# Patient Record
Sex: Male | Born: 1975
Health system: Southern US, Community
[De-identification: ages and names within clinical notes are randomized; demographics above are authoritative.]

## PROBLEM LIST (undated history)

## (undated) DIAGNOSIS — E785 Hyperlipidemia, unspecified: Secondary | ICD-10-CM

## (undated) DIAGNOSIS — N189 Chronic kidney disease, unspecified: Secondary | ICD-10-CM

## (undated) HISTORY — DX: Chronic kidney disease, unspecified: N18.9

## (undated) HISTORY — DX: Hyperlipidemia, unspecified: E78.5

---

## 1999-12-03 HISTORY — PX: APPENDECTOMY: SHX54

## 2012-07-17 ENCOUNTER — Encounter: Payer: Self-pay | Admitting: Internal Medicine

## 2012-07-17 ENCOUNTER — Ambulatory Visit (INDEPENDENT_AMBULATORY_CARE_PROVIDER_SITE_OTHER): Payer: BC Managed Care – PPO | Admitting: Internal Medicine

## 2012-07-17 VITALS — BP 102/70 | HR 67 | Temp 97.0°F | Ht 71.5 in | Wt 141.1 lb

## 2012-07-17 DIAGNOSIS — Z0001 Encounter for general adult medical examination with abnormal findings: Secondary | ICD-10-CM | POA: Insufficient documentation

## 2012-07-17 DIAGNOSIS — L259 Unspecified contact dermatitis, unspecified cause: Secondary | ICD-10-CM

## 2012-07-17 DIAGNOSIS — M25561 Pain in right knee: Secondary | ICD-10-CM | POA: Insufficient documentation

## 2012-07-17 DIAGNOSIS — Z0000 Encounter for general adult medical examination without abnormal findings: Secondary | ICD-10-CM

## 2012-07-17 DIAGNOSIS — M25569 Pain in unspecified knee: Secondary | ICD-10-CM

## 2012-07-17 MED ORDER — METHYLPREDNISOLONE ACETATE 80 MG/ML IJ SUSP
120.0000 mg | Freq: Once | INTRAMUSCULAR | Status: AC
  Administered 2012-07-17: 120 mg via INTRAMUSCULAR

## 2012-07-17 MED ORDER — MELOXICAM 15 MG PO TABS: 15.0000 mg | ORAL_TABLET | Freq: Every day | ORAL | Status: DC

## 2012-07-17 MED ORDER — TRIAMCINOLONE ACETONIDE 0.1 % EX CREA: TOPICAL_CREAM | Freq: Two times a day (BID) | CUTANEOUS | Status: DC

## 2012-07-17 MED ORDER — PREDNISONE 10 MG PO TABS: ORAL_TABLET | ORAL | Status: DC

## 2012-07-17 NOTE — Patient Instructions (Addendum)
You had the steroid shot today Take all new medications as prescribed Continue all other medications as before Please call if not improved, for referral to orthopedics such as Dr Darrick Penna Please return in 6 mo with Lab testing done 3-5 days before

## 2012-07-18 ENCOUNTER — Encounter: Payer: Self-pay | Admitting: Internal Medicine

## 2012-07-18 NOTE — Assessment & Plan Note (Addendum)
PE benign, hx c/w iliotibial band disorder, for nsaid prn, for slower start to running distances to begin, consider ortho eval and/or shoe evaluation if he pronates, as well as consider run on more level gound, declines film today

## 2012-07-18 NOTE — Progress Notes (Signed)
  Subjective:    Patient ID: Marvin [name], male    DOB: [birth year], 36 y.o.   MRN: [id number]  HPI  Here as new pt to establish, c/o rash after an episode of walking in the woods that seems to be spreading below the knees, and some to the arms as well, 3 weeks, not better or worse with anything, no fever or trauma.  Also with c/o sharp  lateral right knee pain after starting up a new running for exercise a few wks ago, no pain with walking , but only with starting to run.  No swelling, giveaways, or falls.  No prior hx.  Overall mild but recurs every time.  Pt denies chest pain, increased sob or doe, wheezing, orthopnea, PND, increased LE swelling, palpitations, dizziness or syncope.   Pt denies polydipsia, polyuria.  Pt denies new neurological symptoms such as new headache, or facial or extremity weakness or numbness   Pt denies fever, wt loss, night sweats, loss of appetite, or other constitutional symptoms History reviewed. No pertinent past medical history. Past Surgical History  Procedure Date  . Appendectomy 2001    reports that he has never smoked. He has never used smokeless tobacco. He reports that he drinks alcohol. He reports that he does not use illicit drugs. family history includes Diabetes in his other; Heart disease in his other; Hyperlipidemia in his other; Hypertension in his other; and Sudden death in his other. No Known Allergies No current outpatient prescriptions on file prior to visit.   Review of Systems All otherwise neg per pt   Objective:   Physical Exam BP 102/70  Pulse 67  Temp 97 F (36.1 C) (Oral)  Ht 5' 11.5" (1.816 m)  Wt 141 lb 2 oz (64.014 kg)  BMI 19.41 kg/m2  SpO2 98% Physical Exam  VS noted Constitutional: Pt appears well-developed and well-nourished.  HENT: Head: Normocephalic.  Right Ear: External ear normal.  Left Ear: External ear normal.  Eyes: Conjunctivae and EOM are normal. Pupils are equal, round, and reactive to light.  Neck: Normal  range of motion. Neck supple.  Cardiovascular: Normal rate and regular rhythm.   Pulmonary/Chest: Effort normal and breath sounds normal.  Abd:  Soft, NT, non-distended, + BS Right knee with FROM, NT, no swelling, lockman's neg Neurological: Pt is alert. Not confused  Motor/dtr/gait intact Skin: Skin is warm. Quite dramatic weepy extensive but typical contact dermatitis like rash below the knees pretibial bilat, several lesion to arms as well Psychiatric: Pt behavior is normal. Thought content normal.     Assessment & Plan:

## 2012-07-18 NOTE — Assessment & Plan Note (Signed)
Mild to mod, for depomedrol IM, predpack asd,  to f/u any worsening symptoms or concerns 

## 2013-01-13 ENCOUNTER — Other Ambulatory Visit (INDEPENDENT_AMBULATORY_CARE_PROVIDER_SITE_OTHER): Payer: BC Managed Care – PPO

## 2013-01-13 DIAGNOSIS — Z0000 Encounter for general adult medical examination without abnormal findings: Secondary | ICD-10-CM

## 2013-01-13 LAB — CBC WITH DIFFERENTIAL/PLATELET
Basophils Absolute: 0 10*3/uL (ref 0.0–0.1)
Basophils Relative: 1 % (ref 0.0–3.0)
Eosinophils Absolute: 0.1 10*3/uL (ref 0.0–0.7)
Eosinophils Relative: 2 % (ref 0.0–5.0)
HCT: 48.3 % (ref 39.0–52.0)
Hemoglobin: 16.9 g/dL (ref 13.0–17.0)
Lymphocytes Relative: 40.2 % (ref 12.0–46.0)
Lymphs Abs: 2 10*3/uL (ref 0.7–4.0)
MCHC: 34.9 g/dL (ref 30.0–36.0)
MCV: 90.8 fl (ref 78.0–100.0)
Monocytes Absolute: 0.4 10*3/uL (ref 0.1–1.0)
Monocytes Relative: 8.8 % (ref 3.0–12.0)
Neutro Abs: 2.3 10*3/uL (ref 1.4–7.7)
Neutrophils Relative %: 48 % (ref 43.0–77.0)
Platelets: 184 10*3/uL (ref 150.0–400.0)
RBC: 5.32 Mil/uL (ref 4.22–5.81)
RDW: 12.4 % (ref 11.5–14.6)
WBC: 4.9 10*3/uL (ref 4.5–10.5)

## 2013-01-13 LAB — HEPATIC FUNCTION PANEL
ALT: 22 U/L (ref 0–53)
AST: 22 U/L (ref 0–37)
Albumin: 4.4 g/dL (ref 3.5–5.2)
Alkaline Phosphatase: 48 U/L (ref 39–117)
Bilirubin, Direct: 0.2 mg/dL (ref 0.0–0.3)
Total Bilirubin: 1.5 mg/dL — ABNORMAL HIGH (ref 0.3–1.2)
Total Protein: 7 g/dL (ref 6.0–8.3)

## 2013-01-13 LAB — BASIC METABOLIC PANEL
BUN: 15 mg/dL (ref 6–23)
CO2: 29 mEq/L (ref 19–32)
Calcium: 9.7 mg/dL (ref 8.4–10.5)
Chloride: 102 mEq/L (ref 96–112)
Creatinine, Ser: 0.9 mg/dL (ref 0.4–1.5)
GFR: 107.75 mL/min (ref >60.00)
Glucose, Bld: 88 mg/dL (ref 70–99)
Potassium: 3.9 mEq/L (ref 3.5–5.1)
Sodium: 139 mEq/L (ref 135–145)

## 2013-01-13 LAB — URINALYSIS, ROUTINE W REFLEX MICROSCOPIC
Bilirubin Urine: NEGATIVE
Hgb urine dipstick: NEGATIVE
Ketones, ur: NEGATIVE
Leukocytes, UA: NEGATIVE
Nitrite: NEGATIVE
Specific Gravity, Urine: >=1.03 (ref 1.000–1.030)
Total Protein, Urine: NEGATIVE
Urine Glucose: NEGATIVE
Urobilinogen, UA: 0.2 (ref 0.0–1.0)
pH: 5.5 (ref 5.0–8.0)

## 2013-01-13 LAB — LIPID PANEL
Cholesterol: 189 mg/dL (ref 0–200)
HDL: 52.6 mg/dL (ref >39.00)
LDL Cholesterol: 119 mg/dL — ABNORMAL HIGH (ref 0–99)
Total CHOL/HDL Ratio: 4
Triglycerides: 85 mg/dL (ref 0.0–149.0)
VLDL: 17 mg/dL (ref 0.0–40.0)

## 2013-01-13 LAB — TSH: TSH: 1.29 u[IU]/mL (ref 0.35–5.50)

## 2013-01-18 ENCOUNTER — Other Ambulatory Visit (INDEPENDENT_AMBULATORY_CARE_PROVIDER_SITE_OTHER): Payer: BC Managed Care – PPO

## 2013-01-18 ENCOUNTER — Encounter: Payer: Self-pay | Admitting: Internal Medicine

## 2013-01-18 ENCOUNTER — Ambulatory Visit (INDEPENDENT_AMBULATORY_CARE_PROVIDER_SITE_OTHER): Payer: BC Managed Care – PPO | Admitting: Internal Medicine

## 2013-01-18 VITALS — BP 110/74 | HR 59 | Temp 98.4°F | Ht 72.0 in | Wt 142.5 lb

## 2013-01-18 DIAGNOSIS — D6859 Other primary thrombophilia: Secondary | ICD-10-CM

## 2013-01-18 DIAGNOSIS — E7800 Pure hypercholesterolemia, unspecified: Secondary | ICD-10-CM | POA: Insufficient documentation

## 2013-01-18 DIAGNOSIS — Z0000 Encounter for general adult medical examination without abnormal findings: Secondary | ICD-10-CM

## 2013-01-18 DIAGNOSIS — E785 Hyperlipidemia, unspecified: Secondary | ICD-10-CM

## 2013-01-18 DIAGNOSIS — L259 Unspecified contact dermatitis, unspecified cause: Secondary | ICD-10-CM

## 2013-01-18 DIAGNOSIS — R21 Rash and other nonspecific skin eruption: Secondary | ICD-10-CM | POA: Insufficient documentation

## 2013-01-18 HISTORY — DX: Hyperlipidemia, unspecified: E78.5

## 2013-01-18 LAB — HEPATIC FUNCTION PANEL
ALT: 25 U/L (ref 0–53)
AST: 24 U/L (ref 0–37)
Albumin: 4.8 g/dL (ref 3.5–5.2)
Alkaline Phosphatase: 47 U/L (ref 39–117)
Bilirubin, Direct: 0.2 mg/dL (ref 0.0–0.3)
Total Bilirubin: 1 mg/dL (ref 0.3–1.2)
Total Protein: 7.6 g/dL (ref 6.0–8.3)

## 2013-01-18 LAB — BASIC METABOLIC PANEL
BUN: 12 mg/dL (ref 6–23)
CO2: 29 mEq/L (ref 19–32)
Calcium: 9.8 mg/dL (ref 8.4–10.5)
Chloride: 103 mEq/L (ref 96–112)
Creatinine, Ser: 0.8 mg/dL (ref 0.4–1.5)
GFR: 112.3 mL/min (ref >60.00)
Glucose, Bld: 76 mg/dL (ref 70–99)
Potassium: 4.1 mEq/L (ref 3.5–5.1)
Sodium: 140 mEq/L (ref 135–145)

## 2013-01-18 LAB — LIPID PANEL
Cholesterol: 202 mg/dL — ABNORMAL HIGH (ref 0–200)
HDL: 61.2 mg/dL (ref >39.00)
Total CHOL/HDL Ratio: 3
Triglycerides: 98 mg/dL (ref 0.0–149.0)
VLDL: 19.6 mg/dL (ref 0.0–40.0)

## 2013-01-18 LAB — URINALYSIS, ROUTINE W REFLEX MICROSCOPIC
Bilirubin Urine: NEGATIVE
Ketones, ur: NEGATIVE
Leukocytes, UA: NEGATIVE
Nitrite: NEGATIVE
Specific Gravity, Urine: >=1.03 (ref 1.000–1.030)
Total Protein, Urine: NEGATIVE
Urine Glucose: NEGATIVE
Urobilinogen, UA: 0.2 (ref 0.0–1.0)
pH: 6 (ref 5.0–8.0)

## 2013-01-18 LAB — LDL CHOLESTEROL, DIRECT: Direct LDL: 114.1 mg/dL

## 2013-01-18 LAB — CBC WITH DIFFERENTIAL/PLATELET
Basophils Absolute: 0 10*3/uL (ref 0.0–0.1)
Basophils Relative: 0.4 % (ref 0.0–3.0)
Eosinophils Absolute: 0.1 10*3/uL (ref 0.0–0.7)
Eosinophils Relative: 1.9 % (ref 0.0–5.0)
HCT: 50 % (ref 39.0–52.0)
Hemoglobin: 17.5 g/dL — ABNORMAL HIGH (ref 13.0–17.0)
Lymphocytes Relative: 30.1 % (ref 12.0–46.0)
Lymphs Abs: 1.4 10*3/uL (ref 0.7–4.0)
MCHC: 34.9 g/dL (ref 30.0–36.0)
MCV: 89.9 fl (ref 78.0–100.0)
Monocytes Absolute: 0.4 10*3/uL (ref 0.1–1.0)
Monocytes Relative: 9.2 % (ref 3.0–12.0)
Neutro Abs: 2.6 10*3/uL (ref 1.4–7.7)
Neutrophils Relative %: 58.4 % (ref 43.0–77.0)
Platelets: 185 10*3/uL (ref 150.0–400.0)
RBC: 5.56 Mil/uL (ref 4.22–5.81)
RDW: 12.4 % (ref 11.5–14.6)
WBC: 4.5 10*3/uL (ref 4.5–10.5)

## 2013-01-18 LAB — TSH: TSH: 0.94 u[IU]/mL (ref 0.35–5.50)

## 2013-01-18 MED ORDER — TRIAMCINOLONE ACETONIDE 0.1 % EX CREA: TOPICAL_CREAM | Freq: Two times a day (BID) | CUTANEOUS | Status: AC

## 2013-01-18 NOTE — Progress Notes (Signed)
Subjective:    Patient ID: Marvin [name], male    DOB: 1976/04/09, 37 y.o.   MRN: [id number]  HPI   Here for wellness and f/u;  Overall doing ok;  Pt denies CP, worsening SOB, DOE, wheezing, orthopnea, PND, worsening LE edema, palpitations, dizziness or syncope.  Pt denies neurological change such as new headache, facial or extremity weakness.  Pt denies polydipsia, polyuria, or low sugar symptoms. Pt states overall good compliance with treatment and medications, good tolerability, and has been trying to follow lower cholesterol diet.  Pt denies worsening depressive symptoms, suicidal ideation or panic. No fever, night sweats, wt loss, loss of appetite, or other constitutional symptoms.  Pt states good ability with ADL's, has low fall risk, home safety reviewed and adequate, no other significant changes in hearing or vision, and only occasionally active with exercise.  Pt mentions sister died at 29yo of PE after triggered event (foot surgury).  Father bruises easily but only occurred as he got older.   No past medical history on file. Past Surgical History  Procedure Laterality Date  . Appendectomy  2001    reports that he has never smoked. He has never used smokeless tobacco. He reports that  drinks alcohol. He reports that he does not use illicit drugs. family history includes Diabetes in his other; Heart disease in his other; Hyperlipidemia in his other; Hypertension in his other; and Sudden death in his other. No Known Allergies No current outpatient prescriptions on file prior to visit.   No current facility-administered medications on file prior to visit.   Review of Systems Constitutional: Negative for diaphoresis, activity change, appetite change or unexpected weight change.  HENT: Negative for hearing loss, ear pain, facial swelling, mouth sores and neck stiffness.   Eyes: Negative for pain, redness and visual disturbance.  Respiratory: Negative for shortness of breath and wheezing.    Cardiovascular: Negative for chest pain and palpitations.  Gastrointestinal: Negative for diarrhea, blood in stool, abdominal distention or other pain Genitourinary: Negative for hematuria, flank pain or change in urine volume.  Musculoskeletal: Negative for myalgias and joint swelling.  Skin: Negative for color change and wound.  Neurological: Negative for syncope and numbness. other than noted Hematological: Negative for adenopathy.  Psychiatric/Behavioral: Negative for hallucinations, self-injury, decreased concentration and agitation.      Objective:   Physical Exam BP 110/74  Pulse 59  Temp(Src) 98.4 F (36.9 C) (Oral)  Ht 6' (1.829 m)  Wt 142 lb 8 oz (64.638 kg)  BMI 19.32 kg/m2  SpO2 97% VS noted,  Constitutional: Pt is oriented to person, place, and time. Appears well-developed and well-nourished.  Head: Normocephalic and atraumatic.  Right Ear: External ear normal.  Left Ear: External ear normal.  Nose: Nose normal.  Mouth/Throat: Oropharynx is clear and moist.  Eyes: Conjunctivae and EOM are normal. Pupils are equal, round, and reactive to light.  Neck: Normal range of motion. Neck supple. No JVD present. No tracheal deviation present.  Cardiovascular: Normal rate, regular rhythm, normal heart sounds and intact distal pulses.   Pulmonary/Chest: Effort normal and breath sounds normal.  Abdominal: Soft. Bowel sounds are normal. There is no tenderness. No HSM  Musculoskeletal: Normal range of motion. Exhibits no edema.  Lymphadenopathy:  Has no cervical adenopathy.  Neurological: Pt is alert and oriented to person, place, and time. Pt has normal reflexes. No cranial nerve deficit.  Skin: Skin is warm and dry. Has 2 small scaly symmetrical bilat mid leg erythem nontender lesions  Psychiatric:  Has  normal mood and affect. Behavior is normal.     Assessment & Plan:

## 2013-01-18 NOTE — Assessment & Plan Note (Signed)
Itchy bilat mid legs, very small somewhat scaly, nontender, unclear etiology - for triam cr, to derm if not improved or controlled

## 2013-01-18 NOTE — Patient Instructions (Addendum)
Please take all new medication as prescribed - the cream Please continue all other medications as before, and refills have been done if requested. You are otherwise up to date with prevention measures today. Please go to the LAB in the Basement (turn left off the elevator) for the tests to be done today You will be contacted by phone if any changes need to be made immediately.  Otherwise, you will receive a letter about your results with an explanation, but please check with MyChart first. Please fax or drop off you shot record to [phone] Thank you for enrolling in MyChart. Please follow the instructions below to securely access your online medical record. MyChart allows you to send messages to your doctor, view your test results, renew your prescriptions, schedule appointments, and more. To Log into My Chart online, please go by [employer] or [employer] to [email or web address], or download the MyChart App from the [employer] .  Your Username is: [email or web address] (pass [employer]) Please send a practice Message on Mychart later today. Please return in 1 year for your yearly visit, or sooner if needed, with Lab testing done 3-5 days before

## 2013-01-18 NOTE — Assessment & Plan Note (Addendum)
Overall doing well, age appropriate education and counseling updated, referrals for preventative services and immunizations addressed, dietary and smoking counseling addressed, most recent labs reviewed.  I have personally reviewed and have noted: 1) the patient's medical and social history 2) The pt's use of alcohol, tobacco, and illicit drugs 3) The patient's current medications and supplements 4) Functional ability including ADL's, fall risk, home safety risk, hearing and visual impairment 5) Diet and physical activities 6) Evidence for depression or mood disorder 7) The patient's height, weight, and BMI have been recorded in the chart I have made referrals, and provided counseling and education based on review of the above Also for Hypercoag panel per pt reqeust after sister with DVT/and died from PE

## 2013-01-20 LAB — HYPERCOAGULABLE PANEL, COMPREHENSIVE
AntiThromb III Func: 104 % (ref 76–126)
Anticardiolipin IgA: 4 APL U/mL (ref <22)
Anticardiolipin IgG: 15 GPL U/mL (ref <23)
Anticardiolipin IgM: 8 MPL U/mL (ref <11)
Beta-2 Glyco I IgG: 0 G Units (ref <20)
Beta-2-Glycoprotein I IgA: 6 A Units (ref <20)
Beta-2-Glycoprotein I IgM: 9 M Units (ref <20)
DRVVT: 26.4 secs (ref <42.9)
Lupus Anticoagulant: NOT DETECTED
PTT Lupus Anticoagulant: 31.1 secs (ref 28.0–43.0)
Protein C Activity: 181 % — ABNORMAL HIGH (ref 75–133)
Protein C, Total: 84 % (ref 72–160)
Protein S Activity: 108 % (ref 69–129)
Protein S Total: 86 % (ref 60–150)

## 2013-10-07 ENCOUNTER — Other Ambulatory Visit: Payer: Self-pay

## 2014-01-24 ENCOUNTER — Encounter: Payer: BC Managed Care – PPO | Admitting: Internal Medicine

## 2014-01-25 ENCOUNTER — Encounter: Payer: BC Managed Care – PPO | Admitting: Internal Medicine

## 2014-02-08 ENCOUNTER — Ambulatory Visit (INDEPENDENT_AMBULATORY_CARE_PROVIDER_SITE_OTHER): Payer: BC Managed Care – PPO | Admitting: Internal Medicine

## 2014-02-08 ENCOUNTER — Other Ambulatory Visit (INDEPENDENT_AMBULATORY_CARE_PROVIDER_SITE_OTHER): Payer: BC Managed Care – PPO

## 2014-02-08 ENCOUNTER — Encounter: Payer: Self-pay | Admitting: Internal Medicine

## 2014-02-08 VITALS — BP 100/70 | HR 56 | Temp 99.4°F | Ht 72.0 in | Wt 148.2 lb

## 2014-02-08 DIAGNOSIS — Z0000 Encounter for general adult medical examination without abnormal findings: Secondary | ICD-10-CM

## 2014-02-08 LAB — LIPID PANEL
Cholesterol: 191 mg/dL (ref 0–200)
HDL: 54.8 mg/dL (ref >39.00)
LDL Cholesterol: 115 mg/dL — ABNORMAL HIGH (ref 0–99)
Total CHOL/HDL Ratio: 3
Triglycerides: 108 mg/dL (ref 0.0–149.0)
VLDL: 21.6 mg/dL (ref 0.0–40.0)

## 2014-02-08 LAB — URINALYSIS, ROUTINE W REFLEX MICROSCOPIC
Bilirubin Urine: NEGATIVE
Hgb urine dipstick: NEGATIVE
Ketones, ur: NEGATIVE
Leukocytes, UA: NEGATIVE
Nitrite: NEGATIVE
Specific Gravity, Urine: 1.015 (ref 1.000–1.030)
Total Protein, Urine: NEGATIVE
Urine Glucose: NEGATIVE
Urobilinogen, UA: 0.2 (ref 0.0–1.0)
WBC, UA: NONE SEEN — AB (ref 0–?)
pH: 7 (ref 5.0–8.0)

## 2014-02-08 LAB — BASIC METABOLIC PANEL
BUN: 14 mg/dL (ref 6–23)
CO2: 30 mEq/L (ref 19–32)
Calcium: 9.5 mg/dL (ref 8.4–10.5)
Chloride: 102 mEq/L (ref 96–112)
Creatinine, Ser: 0.8 mg/dL (ref 0.4–1.5)
GFR: 110.11 mL/min (ref >60.00)
Glucose, Bld: 89 mg/dL (ref 70–99)
Potassium: 4.3 mEq/L (ref 3.5–5.1)
Sodium: 139 mEq/L (ref 135–145)

## 2014-02-08 LAB — CBC WITH DIFFERENTIAL/PLATELET
Basophils Absolute: 0 10*3/uL (ref 0.0–0.1)
Basophils Relative: 0.6 % (ref 0.0–3.0)
Eosinophils Absolute: 0.2 10*3/uL (ref 0.0–0.7)
Eosinophils Relative: 2.3 % (ref 0.0–5.0)
HCT: 48.1 % (ref 39.0–52.0)
Hemoglobin: 16.5 g/dL (ref 13.0–17.0)
Lymphocytes Relative: 34.9 % (ref 12.0–46.0)
Lymphs Abs: 2.5 10*3/uL (ref 0.7–4.0)
MCHC: 34.3 g/dL (ref 30.0–36.0)
MCV: 92.1 fl (ref 78.0–100.0)
Monocytes Absolute: 0.6 10*3/uL (ref 0.1–1.0)
Monocytes Relative: 8.6 % (ref 3.0–12.0)
Neutro Abs: 3.9 10*3/uL (ref 1.4–7.7)
Neutrophils Relative %: 53.6 % (ref 43.0–77.0)
Platelets: 221 10*3/uL (ref 150.0–400.0)
RBC: 5.22 Mil/uL (ref 4.22–5.81)
RDW: 12.5 % (ref 11.5–14.6)
WBC: 7.2 10*3/uL (ref 4.5–10.5)

## 2014-02-08 LAB — TSH: TSH: 1.04 u[IU]/mL (ref 0.35–5.50)

## 2014-02-08 NOTE — Assessment & Plan Note (Signed)

## 2014-02-08 NOTE — Progress Notes (Signed)
Subjective:    Patient ID: Marvin [name], male    DOB: 1976-03-23, 38 y.o.   MRN: [id number]  HPI  Here for wellness and f/u;  Overall doing ok;  Pt denies CP, worsening SOB, DOE, wheezing, orthopnea, PND, worsening LE edema, palpitations, dizziness or syncope.  Pt denies neurological change such as new headache, facial or extremity weakness.  Pt denies polydipsia, polyuria, or low sugar symptoms. Pt states overall good compliance with treatment and medications, good tolerability, and has been trying to follow lower cholesterol diet.  Pt denies worsening depressive symptoms, suicidal ideation or panic. No fever, night sweats, wt loss, loss of appetite, or other constitutional symptoms.  Pt states good ability with ADL's, has low fall risk, home safety reviewed and adequate, no other significant changes in hearing or vision, and only occasionally active with exercise., did go hiking over the past wkend. No acute complaints Past Medical History  Diagnosis Date  . Other and unspecified hyperlipidemia 01/18/2013   Past Surgical History  Procedure Laterality Date  . Appendectomy  2001    reports that he has never smoked. He has never used smokeless tobacco. He reports that he drinks alcohol. He reports that he does not use illicit drugs. family history includes Diabetes in his other; Heart disease in his other; Hyperlipidemia in his other; Hypertension in his other; Sudden death in his other. No Known Allergies No current outpatient prescriptions on file prior to visit.   No current facility-administered medications on file prior to visit.   Review of Systems Constitutional: Negative for diaphoresis, activity change, appetite change or unexpected weight change.  HENT: Negative for hearing loss, ear pain, facial swelling, mouth sores and neck stiffness.   Eyes: Negative for pain, redness and visual disturbance.  Respiratory: Negative for shortness of breath and wheezing.   Cardiovascular:  Negative for chest pain and palpitations.  Gastrointestinal: Negative for diarrhea, blood in stool, abdominal distention or other pain Genitourinary: Negative for hematuria, flank pain or change in urine volume.  Musculoskeletal: Negative for myalgias and joint swelling.  Skin: Negative for color change and wound.  Neurological: Negative for syncope and numbness. other than noted Hematological: Negative for adenopathy.  Psychiatric/Behavioral: Negative for hallucinations, self-injury, decreased concentration and agitation.      Objective:   Physical Exam BP 100/70  Pulse 56  Temp(Src) 99.4 F (37.4 C) (Oral)  Ht 6' (1.829 m)  Wt 148 lb 4 oz (67.246 kg)  BMI 20.10 kg/m2  SpO2 97% VS noted,  Constitutional: Pt is oriented to person, place, and time. Appears well-developed and well-nourished.  Head: Normocephalic and atraumatic.  Right Ear: External ear normal.  Left Ear: External ear normal.  Nose: Nose normal.  Mouth/Throat: Oropharynx is clear and moist.  Eyes: Conjunctivae and EOM are normal. Pupils are equal, round, and reactive to light.  Neck: Normal range of motion. Neck supple. No JVD present. No tracheal deviation present.  Cardiovascular: Normal rate, regular rhythm, normal heart sounds and intact distal pulses.   Pulmonary/Chest: Effort normal and breath sounds normal.  Abdominal: Soft. Bowel sounds are normal. There is no tenderness. No HSM  Musculoskeletal: Normal range of motion. Exhibits no edema.  Lymphadenopathy:  Has no cervical adenopathy.  Neurological: Pt is alert and oriented to person, place, and time. Pt has normal reflexes. No cranial nerve deficit.  Skin: Skin is warm and dry. No rash noted.  Psychiatric:  Has  normal mood and affect. Behavior is normal.      Assessment &  Plan:

## 2014-02-08 NOTE — Patient Instructions (Signed)
Please continue all other medications as before, and refills have been done if requested.  Please have the pharmacy call with any other refills you may need.  Please continue your efforts at being more active, low cholesterol diet, and weight control.  You are otherwise up to date with prevention measures today.  Please keep your appointments with your specialists as you may have planned  Please go to the LAB in the Basement (turn left off the elevator) for the tests to be done today  You will be contacted by phone if any changes need to be made immediately.  Otherwise, you will receive a letter about your results with an explanation, but please check with MyChart first  Please return in 1 year for your yearly visit, or sooner if needed, with Lab testing done 3-5 days before  

## 2014-02-08 NOTE — Progress Notes (Signed)
Pre visit review using our clinic review tool, if applicable. No additional management support is needed unless otherwise documented below in the visit note. 

## 2014-02-09 LAB — HEPATIC FUNCTION PANEL
ALT: 20 U/L (ref 0–53)
AST: 21 U/L (ref 0–37)
Albumin: 4.4 g/dL (ref 3.5–5.2)
Alkaline Phosphatase: 55 U/L (ref 39–117)
Bilirubin, Direct: 0.2 mg/dL (ref 0.0–0.3)
Total Bilirubin: 0.8 mg/dL (ref 0.3–1.2)
Total Protein: 7.1 g/dL (ref 6.0–8.3)

## 2014-12-09 ENCOUNTER — Ambulatory Visit (INDEPENDENT_AMBULATORY_CARE_PROVIDER_SITE_OTHER): Payer: BLUE CROSS/BLUE SHIELD | Admitting: Internal Medicine

## 2014-12-09 ENCOUNTER — Encounter: Payer: Self-pay | Admitting: Internal Medicine

## 2014-12-09 VITALS — BP 110/80 | HR 70 | Temp 98.4°F | Ht 72.0 in | Wt 149.4 lb

## 2014-12-09 DIAGNOSIS — J029 Acute pharyngitis, unspecified: Secondary | ICD-10-CM

## 2014-12-09 MED ORDER — AMOXICILLIN 500 MG PO CAPS: 1000.0000 mg | ORAL_CAPSULE | Freq: Two times a day (BID) | ORAL | Status: DC

## 2014-12-09 NOTE — Progress Notes (Signed)
Pre visit review using our clinic review tool, if applicable. No additional management support is needed unless otherwise documented below in the visit note. 

## 2014-12-09 NOTE — Patient Instructions (Signed)
Please take all new medication as prescribed - the antibiotic  Please continue all other medications as before, and refills have been done if requested.  Please have the pharmacy call with any other refills you may need.  Please keep your appointments with your specialists as you may have planned  We'll see you at your Physical soon.

## 2014-12-09 NOTE — Progress Notes (Signed)
   Subjective:    Patient ID: Marvin [name], male    DOB: [birth year], 39 y.o.   MRN: [id number]  HPI   Here with 2-3 days acute onset fever, severe ST, but no facial pain, pressure or congestion, + for headache, general weakness and slight non prod cough, but pt denies chest pain, wheezing, increased sob or doe, orthopnea, PND, increased LE swelling, palpitations, dizziness or syncope. Wife with confirmed strep pharyngitis.   Past Medical History  Diagnosis Date  . Other and unspecified hyperlipidemia 01/18/2013   Past Surgical History  Procedure Laterality Date  . Appendectomy  2001    reports that he has never smoked. He has never used smokeless tobacco. He reports that he drinks alcohol. He reports that he does not use illicit drugs. family history includes Diabetes in his other; Heart disease in his other; Hyperlipidemia in his other; Hypertension in his other; Sudden death in his other. No Known Allergies No current outpatient prescriptions on file prior to visit.   No current facility-administered medications on file prior to visit.     Review of Systems All otherwise neg per pt     Objective:   Physical Exam BP 110/80 mmHg  Pulse 70  Temp(Src) 98.4 F (36.9 C) (Oral)  Ht 6' (1.829 m)  Wt 149 lb 6 oz (67.756 kg)  BMI 20.25 kg/m2  SpO2 97% VS noted, mild ill Constitutional: Pt appears well-developed, well-nourished.  HENT: Head: NCAT.  Right Ear: External ear normal.  Left Ear: External ear normal.  Eyes: . Pupils are equal, round, and reactive to light. Conjunctivae and EOM are normal Bilat tm's with mild erythema.  Max sinus areas mild tender.  Pharynx with severe erythema, + exudate Neck: Normal range of motion. Neck supple.  Cardiovascular: Normal rate and regular rhythm.   Pulmonary/Chest: Effort normal and breath sounds without rales or wheezing.  Neurological: Pt is alert. Not confused , motor grossly intact Skin: Skin is warm. No rash Psychiatric: Pt  behavior is normal. No agitation.     Assessment & Plan:

## 2014-12-11 NOTE — Assessment & Plan Note (Signed)
Mild to mod, wife with proven strep, for antibx course,  to f/u any worsening symptoms or concerns

## 2015-02-01 ENCOUNTER — Other Ambulatory Visit (INDEPENDENT_AMBULATORY_CARE_PROVIDER_SITE_OTHER): Payer: BLUE CROSS/BLUE SHIELD

## 2015-02-01 DIAGNOSIS — Z0000 Encounter for general adult medical examination without abnormal findings: Secondary | ICD-10-CM

## 2015-02-01 LAB — LIPID PANEL
Cholesterol: 199 mg/dL (ref 0–200)
HDL: 49.2 mg/dL (ref >39.00)
LDL Cholesterol: 125 mg/dL — ABNORMAL HIGH (ref 0–99)
NonHDL: 149.8
Total CHOL/HDL Ratio: 4
Triglycerides: 126 mg/dL (ref 0.0–149.0)
VLDL: 25.2 mg/dL (ref 0.0–40.0)

## 2015-02-01 LAB — TSH: TSH: 1.16 u[IU]/mL (ref 0.35–4.50)

## 2015-02-01 LAB — CBC WITH DIFFERENTIAL/PLATELET
Basophils Absolute: 0.1 10*3/uL (ref 0.0–0.1)
Basophils Relative: 0.7 % (ref 0.0–3.0)
Eosinophils Absolute: 0.1 10*3/uL (ref 0.0–0.7)
Eosinophils Relative: 1.5 % (ref 0.0–5.0)
HCT: 46.9 % (ref 39.0–52.0)
Hemoglobin: 16.1 g/dL (ref 13.0–17.0)
Lymphocytes Relative: 41.6 % (ref 12.0–46.0)
Lymphs Abs: 3.3 10*3/uL (ref 0.7–4.0)
MCHC: 34.3 g/dL (ref 30.0–36.0)
MCV: 89 fl (ref 78.0–100.0)
Monocytes Absolute: 0.6 10*3/uL (ref 0.1–1.0)
Monocytes Relative: 7.9 % (ref 3.0–12.0)
Neutro Abs: 3.9 10*3/uL (ref 1.4–7.7)
Neutrophils Relative %: 48.3 % (ref 43.0–77.0)
Platelets: 220 10*3/uL (ref 150.0–400.0)
RBC: 5.27 Mil/uL (ref 4.22–5.81)
RDW: 13.6 % (ref 11.5–15.5)
WBC: 8 10*3/uL (ref 4.0–10.5)

## 2015-02-01 LAB — URINALYSIS, ROUTINE W REFLEX MICROSCOPIC
Bilirubin Urine: NEGATIVE
Hgb urine dipstick: NEGATIVE
Ketones, ur: NEGATIVE
Leukocytes, UA: NEGATIVE
Nitrite: NEGATIVE
RBC / HPF: NONE SEEN (ref 0–?)
Specific Gravity, Urine: >=1.03 — AB (ref 1.000–1.030)
Total Protein, Urine: NEGATIVE
Urine Glucose: NEGATIVE
Urobilinogen, UA: 0.2 (ref 0.0–1.0)
pH: 5.5 (ref 5.0–8.0)

## 2015-02-01 LAB — HEPATIC FUNCTION PANEL
ALT: 126 U/L — ABNORMAL HIGH (ref 0–53)
AST: 44 U/L — ABNORMAL HIGH (ref 0–37)
Albumin: 4.2 g/dL (ref 3.5–5.2)
Alkaline Phosphatase: 96 U/L (ref 39–117)
Bilirubin, Direct: 0.2 mg/dL (ref 0.0–0.3)
Total Bilirubin: 0.8 mg/dL (ref 0.2–1.2)
Total Protein: 7.1 g/dL (ref 6.0–8.3)

## 2015-02-01 LAB — BASIC METABOLIC PANEL
BUN: 13 mg/dL (ref 6–23)
CO2: 30 mEq/L (ref 19–32)
Calcium: 9.7 mg/dL (ref 8.4–10.5)
Chloride: 104 mEq/L (ref 96–112)
Creatinine, Ser: 0.83 mg/dL (ref 0.40–1.50)
GFR: 109.54 mL/min (ref >60.00)
Glucose, Bld: 97 mg/dL (ref 70–99)
Potassium: 4 mEq/L (ref 3.5–5.1)
Sodium: 139 mEq/L (ref 135–145)

## 2015-02-10 ENCOUNTER — Ambulatory Visit (INDEPENDENT_AMBULATORY_CARE_PROVIDER_SITE_OTHER): Payer: BLUE CROSS/BLUE SHIELD | Admitting: Internal Medicine

## 2015-02-10 ENCOUNTER — Other Ambulatory Visit (INDEPENDENT_AMBULATORY_CARE_PROVIDER_SITE_OTHER): Payer: BLUE CROSS/BLUE SHIELD

## 2015-02-10 ENCOUNTER — Encounter: Payer: Self-pay | Admitting: Internal Medicine

## 2015-02-10 VITALS — BP 108/60 | HR 80 | Temp 99.2°F | Resp 18 | Ht 72.0 in | Wt 146.0 lb

## 2015-02-10 DIAGNOSIS — R7989 Other specified abnormal findings of blood chemistry: Secondary | ICD-10-CM | POA: Diagnosis not present

## 2015-02-10 DIAGNOSIS — R945 Abnormal results of liver function studies: Secondary | ICD-10-CM

## 2015-02-10 DIAGNOSIS — M25561 Pain in right knee: Secondary | ICD-10-CM

## 2015-02-10 DIAGNOSIS — Z0000 Encounter for general adult medical examination without abnormal findings: Secondary | ICD-10-CM

## 2015-02-10 DIAGNOSIS — E785 Hyperlipidemia, unspecified: Secondary | ICD-10-CM

## 2015-02-10 LAB — HEPATITIS PANEL, ACUTE
HCV Ab: NEGATIVE
Hep A IgM: NONREACTIVE
Hep B C IgM: NONREACTIVE
Hepatitis B Surface Ag: NEGATIVE

## 2015-02-10 LAB — HEPATIC FUNCTION PANEL
ALT: 50 U/L (ref 0–53)
AST: 24 U/L (ref 0–37)
Albumin: 4.4 g/dL (ref 3.5–5.2)
Alkaline Phosphatase: 85 U/L (ref 39–117)
Bilirubin, Direct: 0.2 mg/dL (ref 0.0–0.3)
Total Bilirubin: 0.8 mg/dL (ref 0.2–1.2)
Total Protein: 7.2 g/dL (ref 6.0–8.3)

## 2015-02-10 LAB — IBC PANEL
Iron: 143 ug/dL (ref 42–165)
Saturation Ratios: 38.5 % (ref 20.0–50.0)
Transferrin: 265 mg/dL (ref 212.0–360.0)

## 2015-02-10 LAB — GAMMA GT: GGT: 51 U/L (ref 7–51)

## 2015-02-10 NOTE — Assessment & Plan Note (Signed)
Asympt, incidnetal finding today, for Abd u/s, also Hepaitits panel, and labs as documented, consider GI referral

## 2015-02-10 NOTE — Assessment & Plan Note (Signed)

## 2015-02-10 NOTE — Progress Notes (Signed)
Subjective:    Patient ID: Marvin [name], male    DOB: 10/03/1976, 39 y.o.   MRN: [id number]  HPI  Here for wellness and f/u;  Overall doing ok;  Pt denies Chest pain, worsening SOB, DOE, wheezing, orthopnea, PND, worsening LE edema, palpitations, dizziness or syncope.  Pt denies neurological change such as new headache, facial or extremity weakness.  Pt denies polydipsia, polyuria, or low sugar symptoms. Pt states overall good compliance with treatment and medications, good tolerability, and has been trying to follow appropriate diet.  Pt denies worsening depressive symptoms, suicidal ideation or panic. No fever, night sweats, wt loss, loss of appetite, or other constitutional symptoms.  Pt states good ability with ADL's, has low fall risk, home safety reviewed and adequate, no other significant changes in hearing or vision, and only occasionally active with exercise. Has not been running recently due to lateral right knee pain, now resolved, thinking about starting again.  No FH or personal hx of liver dz, drinks 3-4 drinks per wk only. No recent significant supplement or otc med use Past Medical History  Diagnosis Date  . Other and unspecified hyperlipidemia 01/18/2013   Past Surgical History  Procedure Laterality Date  . Appendectomy  2001    reports that he has never smoked. He has never used smokeless tobacco. He reports that he drinks alcohol. He reports that he does not use illicit drugs. family history includes Diabetes in his other; Heart disease in his other; Hyperlipidemia in his other; Hypertension in his other; Sudden death in his other. No Known Allergies No current outpatient prescriptions on file prior to visit.   No current facility-administered medications on file prior to visit.   Review of Systems Constitutional: Negative for increased diaphoresis, other activity, appetite or siginficant weight change other than noted HENT: Negative for worsening hearing loss, ear pain,  facial swelling, mouth sores and neck stiffness.   Eyes: Negative for other worsening pain, redness or visual disturbance.  Respiratory: Negative for shortness of breath and wheezing  Cardiovascular: Negative for chest pain and palpitations.  Gastrointestinal: Negative for diarrhea, blood in stool, abdominal distention or other pain Genitourinary: Negative for hematuria, flank pain or change in urine volume.  Musculoskeletal: Negative for myalgias or other joint complaints.  Skin: Negative for color change and wound or drainage.  Neurological: Negative for syncope and numbness. other than noted Hematological: Negative for adenopathy. or other swelling Psychiatric/Behavioral: Negative for hallucinations, SI, self-injury, decreased concentration or other worsening agitation.      Objective:   Physical Exam BP 108/60 mmHg  Pulse 80  Temp(Src) 99.2 F (37.3 C) (Oral)  Resp 18  Ht 6' (1.829 m)  Wt 146 lb 0.6 oz (66.243 kg)  BMI 19.80 kg/m2  SpO2 97% VS noted,  Constitutional: Pt is oriented to person, place, and time. Appears well-developed and well-nourished, in no significant distress Head: Normocephalic and atraumatic.  Right Ear: External ear normal.  Left Ear: External ear normal.  Nose: Nose normal.  Mouth/Throat: Oropharynx is clear and moist.  Eyes: Conjunctivae and EOM are normal. Pupils are equal, round, and reactive to light.  Neck: Normal range of motion. Neck supple. No JVD present. No tracheal deviation present or significant neck LA or mass Cardiovascular: Normal rate, regular rhythm, normal heart sounds and intact distal pulses.   Pulmonary/Chest: Effort normal and breath sounds without rales or wheezing  Abdominal: Soft. Bowel sounds are normal. NT. No HSM  Musculoskeletal: Normal range of motion. Exhibits no edema.  Lymphadenopathy:  Has no cervical adenopathy.  Neurological: Pt is alert and oriented to person, place, and time. Pt has normal reflexes. No cranial  nerve deficit. Motor grossly intact Skin: Skin is warm and dry. No rash noted.  Psychiatric:  Has normal mood and affect. Behavior is normal.      Assessment & Plan:

## 2015-02-10 NOTE — Assessment & Plan Note (Signed)
Mild worsening, to folow lower chol diet Lab Results  Component Value Date   LDLCALC 125* 02/01/2015

## 2015-02-10 NOTE — Patient Instructions (Signed)
Please continue all other medications as before, and refills have been done if requested.  Please have the pharmacy call with any other refills you may need.  Please continue your efforts at being more active, low cholesterol diet, and weight control.  You are otherwise up to date with prevention measures today.  Please keep your appointments with your specialists as you may have planned  Please consider seeing Dr Smith/Sports Medicine if your right knee pain re-occurs  You will be contacted regarding the referral for: abdomen ultrasound  Please go to the LAB in the Basement (turn left off the elevator) for the tests to be done today  You will be contacted by phone if any changes need to be made immediately.  Otherwise, you will receive a letter about your results with an explanation, but please check with MyChart first.  Please remember to sign up for MyChart if you have not done so, as this will be important to you in the future with finding out test results, communicating by private email, and scheduling acute appointments online when needed.  Please return in 1 year for your yearly visit, or sooner if needed, with Lab testing done 3-5 days before

## 2015-02-10 NOTE — Assessment & Plan Note (Signed)
Seems posible ileotibial band like pain, now resolved, exam ok, to re-start running with excercises, to see Dr Ian Bushman med if recurs, f

## 2015-02-10 NOTE — Progress Notes (Signed)
Pre visit review using our clinic review tool, if applicable. No additional management support is needed unless otherwise documented below in the visit note. 

## 2015-02-13 LAB — ANA: Anti Nuclear Antibody(ANA): NEGATIVE

## 2015-02-13 LAB — ALPHA-1-ANTITRYPSIN: A-1 Antitrypsin, Ser: 93 mg/dL (ref 83–199)

## 2015-02-13 LAB — CERULOPLASMIN: Ceruloplasmin: 25 mg/dL (ref 18–36)

## 2015-02-28 ENCOUNTER — Other Ambulatory Visit: Payer: BLUE CROSS/BLUE SHIELD

## 2015-10-06 ENCOUNTER — Ambulatory Visit (INDEPENDENT_AMBULATORY_CARE_PROVIDER_SITE_OTHER): Payer: BLUE CROSS/BLUE SHIELD | Admitting: Internal Medicine

## 2015-10-06 ENCOUNTER — Encounter: Payer: Self-pay | Admitting: Internal Medicine

## 2015-10-06 VITALS — BP 106/60 | HR 80 | Temp 98.5°F | Ht 72.0 in | Wt 150.0 lb

## 2015-10-06 DIAGNOSIS — K644 Residual hemorrhoidal skin tags: Secondary | ICD-10-CM

## 2015-10-06 DIAGNOSIS — K648 Other hemorrhoids: Secondary | ICD-10-CM | POA: Diagnosis not present

## 2015-10-06 MED ORDER — LIDOCAINE-HYDROCORTISONE ACE 3-0.5 % RE CREA: 1.0000 | TOPICAL_CREAM | Freq: Two times a day (BID) | RECTAL | Status: DC | PRN

## 2015-10-06 NOTE — Progress Notes (Signed)
   Subjective:    Patient ID: Marvin [name], male    DOB: 05/21/1976, 39 y.o.   MRN: [id number]  HPI   Here with c/o mild tender lump to the anal area x 4-5 days, no fever, trauma, drainage, hx of malignancy or hemorrhoid.  Denies worsening reflux, abd pain, dysphagia, n/v, bowel change or blood. Past Medical History  Diagnosis Date  . Other and unspecified hyperlipidemia 01/18/2013   Past Surgical History  Procedure Laterality Date  . Appendectomy  2001    reports that he has never smoked. He has never used smokeless tobacco. He reports that he drinks alcohol. He reports that he does not use illicit drugs. family history includes Diabetes in his other; Heart disease in his other; Hyperlipidemia in his other; Hypertension in his other; Sudden death in his other. No Known Allergies No current outpatient prescriptions on file prior to visit.   No current facility-administered medications on file prior to visit.   Review of Systems All otherwise neg per pt     Objective:   Physical Exam BP 106/60 mmHg  Pulse 80  Temp(Src) 98.5 F (36.9 C) (Oral)  Ht 6' (1.829 m)  Wt 150 lb (68.04 kg)  BMI 20.34 kg/m2  SpO2 97% VS noted,  Constitutional: Pt appears in no significant distress HENT: Head: NCAT.  Right Ear: External ear normal.  Left Ear: External ear normal.  Eyes: . Pupils are equal, round, and reactive to light. Conjunctivae and EOM are normal Neck: Normal range of motion. Neck supple.  Cardiovascular: Normal rate and regular rhythm.   Pulmonary/Chest: Effort normal and breath sounds without rales or wheezing.  Abd:  Soft, NT, ND, + BS DRE: visual rectal with 5oclock small thrombosed tender hemorrhoid without drainage or bleeding, no other rectal mass, or prostate enlarged  Neurological: Pt is alert. Not confused , motor grossly intact Skin: Skin is warm. No rash, no LE edema Psychiatric: Pt behavior is normal. No agitation.     Assessment & Plan:

## 2015-10-06 NOTE — Assessment & Plan Note (Signed)
With new small thrombosed, mild to mod pain, for anamantle HC asd,  to f/u any worsening symptoms or concerns

## 2015-10-06 NOTE — Patient Instructions (Signed)
Please take all new medication as prescribed - the cream  Please continue all other medications as before, and refills have been done if requested.  Please have the pharmacy call with any other refills you may need.  Please keep your appointments with your specialists as you may have planned   

## 2016-02-05 ENCOUNTER — Telehealth: Payer: Self-pay | Admitting: Internal Medicine

## 2016-02-05 NOTE — Telephone Encounter (Signed)
Pt will be coming in for lab work before his physical and would like to know if he needs to fast. Please advise

## 2016-02-08 ENCOUNTER — Other Ambulatory Visit (INDEPENDENT_AMBULATORY_CARE_PROVIDER_SITE_OTHER): Payer: BLUE CROSS/BLUE SHIELD

## 2016-02-08 DIAGNOSIS — Z0000 Encounter for general adult medical examination without abnormal findings: Secondary | ICD-10-CM | POA: Diagnosis not present

## 2016-02-08 LAB — URINALYSIS, ROUTINE W REFLEX MICROSCOPIC
Bilirubin Urine: NEGATIVE
Hgb urine dipstick: NEGATIVE
Ketones, ur: NEGATIVE
Leukocytes, UA: NEGATIVE
Nitrite: NEGATIVE
RBC / HPF: NONE SEEN (ref 0–?)
Specific Gravity, Urine: 1.025 (ref 1.000–1.030)
Total Protein, Urine: NEGATIVE
Urine Glucose: NEGATIVE
Urobilinogen, UA: 0.2 (ref 0.0–1.0)
WBC, UA: NONE SEEN (ref 0–?)
pH: 6 (ref 5.0–8.0)

## 2016-02-08 LAB — HEPATIC FUNCTION PANEL
ALT: 23 U/L (ref 0–53)
AST: 18 U/L (ref 0–37)
Albumin: 4.6 g/dL (ref 3.5–5.2)
Alkaline Phosphatase: 54 U/L (ref 39–117)
Bilirubin, Direct: 0.2 mg/dL (ref 0.0–0.3)
Total Bilirubin: 1.2 mg/dL (ref 0.2–1.2)
Total Protein: 7.2 g/dL (ref 6.0–8.3)

## 2016-02-08 LAB — CBC WITH DIFFERENTIAL/PLATELET
Basophils Absolute: 0.1 10*3/uL (ref 0.0–0.1)
Basophils Relative: 1 % (ref 0.0–3.0)
Eosinophils Absolute: 0.1 10*3/uL (ref 0.0–0.7)
Eosinophils Relative: 1.3 % (ref 0.0–5.0)
HCT: 49.2 % (ref 39.0–52.0)
Hemoglobin: 17 g/dL (ref 13.0–17.0)
Lymphocytes Relative: 45.7 % (ref 12.0–46.0)
Lymphs Abs: 2.6 10*3/uL (ref 0.7–4.0)
MCHC: 34.5 g/dL (ref 30.0–36.0)
MCV: 91.1 fl (ref 78.0–100.0)
Monocytes Absolute: 0.5 10*3/uL (ref 0.1–1.0)
Monocytes Relative: 8.5 % (ref 3.0–12.0)
Neutro Abs: 2.5 10*3/uL (ref 1.4–7.7)
Neutrophils Relative %: 43.5 % (ref 43.0–77.0)
Platelets: 204 10*3/uL (ref 150.0–400.0)
RBC: 5.4 Mil/uL (ref 4.22–5.81)
RDW: 12.9 % (ref 11.5–15.5)
WBC: 5.8 10*3/uL (ref 4.0–10.5)

## 2016-02-08 LAB — BASIC METABOLIC PANEL
BUN: 13 mg/dL (ref 6–23)
CO2: 31 mEq/L (ref 19–32)
Calcium: 9.6 mg/dL (ref 8.4–10.5)
Chloride: 103 mEq/L (ref 96–112)
Creatinine, Ser: 0.87 mg/dL (ref 0.40–1.50)
GFR: 103.21 mL/min (ref >60.00)
Glucose, Bld: 90 mg/dL (ref 70–99)
Potassium: 3.9 mEq/L (ref 3.5–5.1)
Sodium: 140 mEq/L (ref 135–145)

## 2016-02-08 LAB — LIPID PANEL
Cholesterol: 191 mg/dL (ref 0–200)
HDL: 57.6 mg/dL (ref >39.00)
LDL Cholesterol: 117 mg/dL — ABNORMAL HIGH (ref 0–99)
NonHDL: 133.6
Total CHOL/HDL Ratio: 3
Triglycerides: 85 mg/dL (ref 0.0–149.0)
VLDL: 17 mg/dL (ref 0.0–40.0)

## 2016-02-08 LAB — PSA: PSA: 0.7 ng/mL (ref 0.10–4.00)

## 2016-02-08 LAB — TSH: TSH: 0.99 u[IU]/mL (ref 0.35–4.50)

## 2016-02-12 ENCOUNTER — Encounter: Payer: Self-pay | Admitting: Internal Medicine

## 2016-02-12 ENCOUNTER — Ambulatory Visit (INDEPENDENT_AMBULATORY_CARE_PROVIDER_SITE_OTHER): Payer: BLUE CROSS/BLUE SHIELD | Admitting: Internal Medicine

## 2016-02-12 VITALS — BP 110/70 | HR 57 | Temp 98.3°F | Ht 72.0 in | Wt 149.0 lb

## 2016-02-12 DIAGNOSIS — E785 Hyperlipidemia, unspecified: Secondary | ICD-10-CM

## 2016-02-12 DIAGNOSIS — Z0000 Encounter for general adult medical examination without abnormal findings: Secondary | ICD-10-CM

## 2016-02-12 NOTE — Patient Instructions (Signed)
Please continue all other medications as before, and refills have been done if requested.  Please have the pharmacy call with any other refills you may need.  Please continue your efforts at being more active, low cholesterol diet, and weight control.  You are otherwise up to date with prevention measures today.  Please keep your appointments with your specialists as you may have planned  Please return in 1 year for your yearly visit, or sooner if needed, with Lab testing done 3-5 days before  

## 2016-02-12 NOTE — Assessment & Plan Note (Signed)
Overall doing well, age appropriate education and counseling updated, referrals for preventative services and immunizations addressed, dietary and smoking counseling addressed, most recent labs reviewed.  I have personally reviewed and have noted:  1) the patient's medical and social history 2) The pt's use of alcohol, tobacco, and illicit drugs 3) The patient's current medications and supplements 4) Functional ability including ADL's, fall risk, home safety risk, hearing and visual impairment 5) Diet and physical activities 6) Evidence for depression or mood disorder 7) The patient's height, weight, and BMI have been recorded in the chart  I have made referrals, and provided counseling and education based on review of the above Lab Results  Component Value Date   WBC 5.8 02/08/2016   HGB 17.0 02/08/2016   HCT 49.2 02/08/2016   PLT 204.0 02/08/2016   GLUCOSE 90 02/08/2016   CHOL 191 02/08/2016   TRIG 85.0 02/08/2016   HDL 57.60 02/08/2016   LDLDIRECT 114.1 01/18/2013   LDLCALC 117* 02/08/2016   ALT 23 02/08/2016   AST 18 02/08/2016   NA 140 02/08/2016   K 3.9 02/08/2016   CL 103 02/08/2016   CREATININE 0.87 02/08/2016   BUN 13 02/08/2016   CO2 31 02/08/2016   TSH 0.99 02/08/2016   PSA 0.70 02/08/2016

## 2016-02-12 NOTE — Assessment & Plan Note (Signed)
Mild increased LDL, goal < 100, with his risk factors very low, I feel would not benefit from asa or statin at this time, ok to cont lower chol diet

## 2016-02-12 NOTE — Progress Notes (Signed)
Subjective:    Patient ID: Marvin [name], male    DOB: 11/26/1976, 40 y.o.   MRN: [id number]  HPI  Here for wellness and f/u;  Overall doing ok;  Pt denies Chest pain, worsening SOB, DOE, wheezing, orthopnea, PND, worsening LE edema, palpitations, dizziness or syncope.  Pt denies neurological change such as new headache, facial or extremity weakness.  Pt denies polydipsia, polyuria, or low sugar symptoms. Pt states overall good compliance with treatment and medications, good tolerability, and has been trying to follow appropriate diet.  Pt denies worsening depressive symptoms, suicidal ideation or panic. No fever, night sweats, wt loss, loss of appetite, or other constitutional symptoms.  Pt states good ability with ADL's, has low fall risk, home safety reviewed and adequate, no other significant changes in hearing or vision, and only occasionally active with exercise.  Father with new prostate cancer recent. Wt Readings from Last 3 Encounters:  02/12/16 149 lb (67.586 kg)  10/06/15 150 lb (68.04 kg)  02/10/15 146 lb 0.6 oz (66.243 kg)   Past Medical History  Diagnosis Date  . Other and unspecified hyperlipidemia 01/18/2013   Past Surgical History  Procedure Laterality Date  . Appendectomy  2001    reports that he has never smoked. He has never used smokeless tobacco. He reports that he drinks alcohol. He reports that he does not use illicit drugs. family history includes Diabetes in his other; Heart disease in his other; Hyperlipidemia in his other; Hypertension in his other; Prostate cancer in his father; Sudden death in his other. No Known Allergies No current outpatient prescriptions on file prior to visit.   No current facility-administered medications on file prior to visit.   Review of Systems Constitutional: Negative for increased diaphoresis, other activity, appetite or siginficant weight change other than noted HENT: Negative for worsening hearing loss, ear pain, facial  swelling, mouth sores and neck stiffness.   Eyes: Negative for other worsening pain, redness or visual disturbance.  Respiratory: Negative for shortness of breath and wheezing  Cardiovascular: Negative for chest pain and palpitations.  Gastrointestinal: Negative for diarrhea, blood in stool, abdominal distention or other pain Genitourinary: Negative for hematuria, flank pain or change in urine volume.  Musculoskeletal: Negative for myalgias or other joint complaints.  Skin: Negative for color change and wound or drainage.  Neurological: Negative for syncope and numbness. other than noted Hematological: Negative for adenopathy. or other swelling Psychiatric/Behavioral: Negative for hallucinations, SI, self-injury, decreased concentration or other worsening agitation.      Objective:   Physical Exam BP 110/70 mmHg  Pulse 57  Temp(Src) 98.3 F (36.8 C) (Oral)  Ht 6' (1.829 m)  Wt 149 lb (67.586 kg)  BMI 20.20 kg/m2  SpO2 97% VS noted,  Constitutional: Pt is oriented to person, place, and time. Appears well-developed and well-nourished, in no significant distress Head: Normocephalic and atraumatic.  Right Ear: External ear normal.  Left Ear: External ear normal.  Nose: Nose normal.  Mouth/Throat: Oropharynx is clear and moist.  Eyes: Conjunctivae and EOM are normal. Pupils are equal, round, and reactive to light.  Neck: Normal range of motion. Neck supple. No JVD present. No tracheal deviation present or significant neck LA or mass Cardiovascular: Normal rate, regular rhythm, normal heart sounds and intact distal pulses.   Pulmonary/Chest: Effort normal and breath sounds without rales or wheezing  Abdominal: Soft. Bowel sounds are normal. NT. No HSM  Musculoskeletal: Normal range of motion. Exhibits no edema.  Lymphadenopathy:  Has no cervical  adenopathy.  Neurological: Pt is alert and oriented to person, place, and time. Pt has normal reflexes. No cranial nerve deficit. Motor  grossly intact Skin: Skin is warm and dry. No rash noted.  Psychiatric:  Has normal mood and affect. Behavior is normal.     Assessment & Plan:

## 2016-02-12 NOTE — Progress Notes (Signed)
Pre visit review using our clinic review tool, if applicable. No additional management support is needed unless otherwise documented below in the visit note. 

## 2016-02-13 ENCOUNTER — Encounter: Payer: BLUE CROSS/BLUE SHIELD | Admitting: Internal Medicine

## 2016-08-28 DIAGNOSIS — Z23 Encounter for immunization: Secondary | ICD-10-CM | POA: Diagnosis not present

## 2017-02-12 ENCOUNTER — Ambulatory Visit (INDEPENDENT_AMBULATORY_CARE_PROVIDER_SITE_OTHER): Payer: BLUE CROSS/BLUE SHIELD | Admitting: Internal Medicine

## 2017-02-12 ENCOUNTER — Encounter: Payer: Self-pay | Admitting: Internal Medicine

## 2017-02-12 ENCOUNTER — Other Ambulatory Visit (INDEPENDENT_AMBULATORY_CARE_PROVIDER_SITE_OTHER): Payer: BLUE CROSS/BLUE SHIELD

## 2017-02-12 VITALS — BP 118/78 | HR 88 | Temp 98.7°F | Ht 72.0 in | Wt 154.0 lb

## 2017-02-12 DIAGNOSIS — M25561 Pain in right knee: Secondary | ICD-10-CM | POA: Diagnosis not present

## 2017-02-12 DIAGNOSIS — Z0000 Encounter for general adult medical examination without abnormal findings: Secondary | ICD-10-CM | POA: Diagnosis not present

## 2017-02-12 DIAGNOSIS — E785 Hyperlipidemia, unspecified: Secondary | ICD-10-CM | POA: Diagnosis not present

## 2017-02-12 LAB — BASIC METABOLIC PANEL
BUN: 12 mg/dL (ref 6–23)
CO2: 31 mEq/L (ref 19–32)
Calcium: 10 mg/dL (ref 8.4–10.5)
Chloride: 104 mEq/L (ref 96–112)
Creatinine, Ser: 0.85 mg/dL (ref 0.40–1.50)
GFR: 105.49 mL/min (ref >60.00)
Glucose, Bld: 85 mg/dL (ref 70–99)
Potassium: 4.2 mEq/L (ref 3.5–5.1)
Sodium: 138 mEq/L (ref 135–145)

## 2017-02-12 LAB — LIPID PANEL
Cholesterol: 206 mg/dL — ABNORMAL HIGH (ref 0–200)
HDL: 54.6 mg/dL (ref >39.00)
LDL Cholesterol: 134 mg/dL — ABNORMAL HIGH (ref 0–99)
NonHDL: 151.82
Total CHOL/HDL Ratio: 4
Triglycerides: 90 mg/dL (ref 0.0–149.0)
VLDL: 18 mg/dL (ref 0.0–40.0)

## 2017-02-12 LAB — CBC WITH DIFFERENTIAL/PLATELET
Basophils Absolute: 0.1 10*3/uL (ref 0.0–0.1)
Basophils Relative: 0.9 % (ref 0.0–3.0)
Eosinophils Absolute: 0.1 10*3/uL (ref 0.0–0.7)
Eosinophils Relative: 1.1 % (ref 0.0–5.0)
HCT: 50.5 % (ref 39.0–52.0)
Hemoglobin: 17.7 g/dL — ABNORMAL HIGH (ref 13.0–17.0)
Lymphocytes Relative: 41 % (ref 12.0–46.0)
Lymphs Abs: 2.3 10*3/uL (ref 0.7–4.0)
MCHC: 35.1 g/dL (ref 30.0–36.0)
MCV: 89.8 fl (ref 78.0–100.0)
Monocytes Absolute: 0.5 10*3/uL (ref 0.1–1.0)
Monocytes Relative: 9.3 % (ref 3.0–12.0)
Neutro Abs: 2.7 10*3/uL (ref 1.4–7.7)
Neutrophils Relative %: 47.7 % (ref 43.0–77.0)
Platelets: 197 10*3/uL (ref 150.0–400.0)
RBC: 5.63 Mil/uL (ref 4.22–5.81)
RDW: 13 % (ref 11.5–15.5)
WBC: 5.6 10*3/uL (ref 4.0–10.5)

## 2017-02-12 LAB — URINALYSIS, ROUTINE W REFLEX MICROSCOPIC
Bilirubin Urine: NEGATIVE
Hgb urine dipstick: NEGATIVE
Ketones, ur: NEGATIVE
Leukocytes, UA: NEGATIVE
Nitrite: NEGATIVE
RBC / HPF: NONE SEEN (ref 0–?)
Specific Gravity, Urine: 1.015 (ref 1.000–1.030)
Total Protein, Urine: NEGATIVE
Urine Glucose: NEGATIVE
Urobilinogen, UA: 0.2 (ref 0.0–1.0)
WBC, UA: NONE SEEN (ref 0–?)
pH: 7.5 (ref 5.0–8.0)

## 2017-02-12 LAB — PSA: PSA: 0.8 ng/mL (ref 0.10–4.00)

## 2017-02-12 LAB — HEPATIC FUNCTION PANEL
ALT: 25 U/L (ref 0–53)
AST: 22 U/L (ref 0–37)
Albumin: 4.6 g/dL (ref 3.5–5.2)
Alkaline Phosphatase: 56 U/L (ref 39–117)
Bilirubin, Direct: 0.2 mg/dL (ref 0.0–0.3)
Total Bilirubin: 1.2 mg/dL (ref 0.2–1.2)
Total Protein: 7.1 g/dL (ref 6.0–8.3)

## 2017-02-12 LAB — TSH: TSH: 1.02 u[IU]/mL (ref 0.35–4.50)

## 2017-02-12 NOTE — Progress Notes (Signed)
Subjective:    Patient ID: Marvin [name], male    DOB: Jan 15, 1976, 41 y.o.   MRN: [id number]  HPI  Here for wellness and f/u;  Overall doing ok;  Pt denies Chest pain, worsening SOB, DOE, wheezing, orthopnea, PND, worsening LE edema, palpitations, dizziness or syncope.  Pt denies neurological change such as new headache, facial or extremity weakness.  Pt denies polydipsia, polyuria, or low sugar symptoms. Pt states overall good compliance with treatment and medications, good tolerability, and has been trying to follow appropriate diet.  Pt denies worsening depressive symptoms, suicidal ideation or panic. No fever, night sweats, wt loss, loss of appetite, or other constitutional symptoms.  Pt states good ability with ADL's, has low fall risk, home safety reviewed and adequate, no other significant changes in hearing or vision, and only occasionally active with exercise.  Has some occas right knee pain and slight swelling, no longer runs as he used to do.   Wt Readings from Last 3 Encounters:  02/12/17 154 lb (69.9 kg)  02/12/16 149 lb (67.6 kg)  10/06/15 150 lb (68 kg)   BP Readings from Last 3 Encounters:  02/12/17 118/78  02/12/16 110/70  10/06/15 106/60  Father with prostate ca last yr. . Past Medical History:  Diagnosis Date  . Other and unspecified hyperlipidemia 01/18/2013   Past Surgical History:  Procedure Laterality Date  . APPENDECTOMY  2001    reports that he has never smoked. He has never used smokeless tobacco. He reports that he drinks alcohol. He reports that he does not use drugs. family history includes Diabetes in his other; Heart disease in his other; Hyperlipidemia in his other; Hypertension in his other; Prostate cancer in his father; Sudden death in his other. No Known Allergies No current outpatient prescriptions on file prior to visit.   No current facility-administered medications on file prior to visit.     Review of Systems Constitutional: Negative for  increased diaphoresis, or other activity, appetite or siginficant weight change other than noted HENT: Negative for worsening hearing loss, ear pain, facial swelling, mouth sores and neck stiffness.   Eyes: Negative for other worsening pain, redness or visual disturbance.  Respiratory: Negative for choking or stridor Cardiovascular: Negative for other chest pain and palpitations.  Gastrointestinal: Negative for worsening diarrhea, blood in stool, or abdominal distention Genitourinary: Negative for hematuria, flank pain or change in urine volume.  Musculoskeletal: Negative for myalgias or other joint complaints.  Skin: Negative for other color change and wound or drainage.  Neurological: Negative for syncope and numbness. other than noted Hematological: Negative for adenopathy. or other swelling Psychiatric/Behavioral: Negative for hallucinations, SI, self-injury, decreased concentration or other worsening agitation.  All other system neg per pt    Objective:   Physical Exam BP 118/78   Pulse 88   Temp 98.7 F (37.1 C)   Ht 6' (1.829 m)   Wt 154 lb (69.9 kg)   SpO2 98%   BMI 20.89 kg/m  VS noted,  Constitutional: Pt is oriented to person, place, and time. Appears well-developed and well-nourished, in no significant distress Head: Normocephalic and atraumatic  Eyes: Conjunctivae and EOM are normal. Pupils are equal, round, and reactive to light Right Ear: External ear normal.  Left Ear: External ear normal Nose: Nose normal.  Mouth/Throat: Oropharynx is clear and moist  Neck: Normal range of motion. Neck supple. No JVD present. No tracheal deviation present or significant neck LA or mass Cardiovascular: Normal rate, regular rhythm, normal heart  sounds and intact distal pulses.   Pulmonary/Chest: Effort normal and breath sounds without rales or wheezing  Abdominal: Soft. Bowel sounds are normal. NT. No HSM  Musculoskeletal: Normal range of motion. Exhibits no edema, right knee with  mild deg changes and crepitus Lymphadenopathy: Has no cervical adenopathy.  Neurological: Pt is alert and oriented to person, place, and time. Pt has normal reflexes. No cranial nerve deficit. Motor grossly intact Skin: Skin is warm and dry. No rash noted or new ulcers Psychiatric:  Has normal mood and affect. Behavior is normal.  No other exam findings  ECG today I have personally interpreted Sinus  Bradycardia  WITHIN NORMAL LIMITS     Assessment & Plan:

## 2017-02-12 NOTE — Patient Instructions (Signed)
Please continue all other medications as before, and refills have been done if requested.  Please have the pharmacy call with any other refills you may need.  Please continue your efforts at being more active, low cholesterol diet, and weight control.  You are otherwise up to date with prevention measures today.  Please keep your appointments with your specialists as you may have planned  Please consider Dr Smith/sports medicine for worsening right knee pain  Please go to the LAB in the Basement (turn left off the elevator) for the tests to be done today  You will be contacted by phone if any changes need to be made immediately.  Otherwise, you will receive a letter about your results with an explanation, but please check with MyChart first.  Please remember to sign up for MyChart if you have not done so, as this will be important to you in the future with finding out test results, communicating by private email, and scheduling acute appointments online when needed.  Please return in 1 year for your yearly visit, or sooner if needed, with Lab testing done 3-5 days before

## 2017-02-13 LAB — HIV ANTIBODY (ROUTINE TESTING W REFLEX): HIV 1&2 Ab, 4th Generation: NONREACTIVE

## 2017-02-16 NOTE — Assessment & Plan Note (Signed)

## 2017-02-16 NOTE — Assessment & Plan Note (Signed)
Mild uncontrolled, declines statin, and to cont lower chol diet Lab Results  Component Value Date   LDLCALC 134 (H) 02/12/2017

## 2017-02-16 NOTE — Assessment & Plan Note (Signed)
c/w mild to mod djd, for alleve prn, consider sport med if worsening or not improved

## 2017-02-20 ENCOUNTER — Encounter: Payer: Self-pay | Admitting: Internal Medicine

## 2017-08-21 NOTE — Progress Notes (Signed)
[name] [address] [place], [place] [address] Phone: [phone] Subjective:     CC: knee and back pain   [id number]  Marvin [name] is a 41 y.o. male coming in with complaint of  Knee pain- He injured his knee as a teenager during cross country. He has been trying to run more and does not want to have hte same issues that he had back in his teen years. Patient denies any swelling, any radiation of pain. Patient though states that the pain can be as bad as 6 out of 10 and can keep him from running from time to time. Seems to be on the right knee. More posterior lateral. Worse with going downhill and topical. Does respond to anti-inflammatories  Back pain- He notes that his thoracic spine bothers him. 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 He notes stiffness in the mornings. He does try to stretch his back but is not sure this is helping. His pain is daily but changes throughout the day. He said that he can stand up to stretch and gets a sharp pain in the front of his ribs verses in his back. Patient rates the severity of pain is 4 out of 10. Does respond to anti-inflammatories    Past Medical History:  Diagnosis Date  . Other and unspecified hyperlipidemia 01/18/2013   Past Surgical History:  Procedure Laterality Date  . APPENDECTOMY  2001   Social History   Social History  . Marital status: Married    Spouse name: N/A  . Number of children: N/A  . Years of education: masters   Occupational History  . Business    Social History Main Topics  . Smoking status: Never Smoker  . Smokeless tobacco: Never Used  . Alcohol use Yes     Comment: occasional social  . Drug use: No  . Sexual activity: Not Asked   Other Topics Concern  . None   Social History Narrative  . None   No Known Allergies Family History  Problem Relation Age of Onset  . Hyperlipidemia Other   . Hypertension Other   . Heart disease Other   . Diabetes Other   . Sudden  death Other   . Prostate cancer Father      Past medical history, social, surgical and family history all reviewed in electronic medical record.  No pertanent information unless stated regarding to the chief complaint.   Review of Systems:Review of systems updated and as accurate as of 08/22/17  No headache, visual changes, nausea, vomiting, diarrhea, constipation, dizziness, abdominal pain, skin rash, fevers, chills, night sweats, weight loss, swollen lymph nodes, body aches, joint swelling, chest pain, shortness of breath, mood changes.  Positive muscle aches  Objective  Blood pressure 120/80, pulse (!) 52, height 6' (1.829 m), weight 157 lb (71.2 kg), SpO2 98 %. Systems examined below as of 08/22/17   General: No apparent distress alert and oriented x3 mood and affect normal, dressed appropriately.  HEENT: Pupils equal, extraocular movements intact  Respiratory: Patient's speak in full sentences and does not appear short of breath  Cardiovascular: No lower extremity edema, non tender, no erythema  Skin: Warm dry intact with no signs of infection or rash on extremities or on axial skeleton.  Abdomen: Soft nontender  Neuro: Cranial nerves II through XII are intact, neurovascularly intact in all extremities with 2+ DTRs and 2+ pulses.  Lymph: No lymphadenopathy of posterior or anterior cervical chain or  axillae bilaterally.  Gait normal with good balance and coordination.  MSK:  Non tender with full range of motion and good stability and symmetric strength and tone of shoulders, elbows, wrist, hip, and ankles bilaterally.  Knee:Right Normal to inspection with no erythema or effusion or obvious bony abnormalities. Mild pain over the lateral aspect of the ROM full in flexion and extension and lower leg rotation. Ligaments with solid consistent endpoints including ACL, PCL, LCL, MCL. Negative Mcmurray's, Apley's, and Thessalonian tests. Mild painful patellar compression. Patellar glide  with moderate crepitus. Patellar and quadriceps tendons unremarkable. Hamstring and quadriceps strength is normal.  Contralateral knee unremarkable   Back Exam:  Inspection: Unremarkable mild increase in kyphosis.  Motion: Flexion 40 deg, Extension 25 deg, Side Bending to 30 deg bilaterally,  Rotation to 35 deg bilaterally  SLR laying: Negative  XSLR laying: Negative  Palpable tenderness: Tender to palpation in the paraspinal musculature lumbar spine. FABER: negative. Sensory change: Gross sensation intact to all lumbar and sacral dermatomes.  Reflexes: 2+ at both patellar tendons, 2+ at achilles tendons, Babinski's downgoing.  Strength at foot  Plantar-flexion: 5/5 Dorsi-flexion: 5/5 Eversion: 5/5 Inversion: 5/5  Leg strength  Quad: 5/5 Hamstring: 5/5 Hip flexor: 5/5 Hip abductors: 5/5  Gait unremarkable.  Osteopathic findings  C6 flexed rotated and side bent right  T2 extended rotated and side bent right inhaled rib T8 extended rotated and side bent left L2 flexed rotated and side bent right Sacrum right on right  Procedure 97110; 15 additional minutes spent for Therapeutic exercises as stated in above notes.  This included exercises focusing on stretching, strengthening, with significant focus on eccentric aspects.   Long term goals include an improvement in range of motion, strength, endurance as well as avoiding reinjury. Patient's frequency would include in 1-2 times a day, 3-5 times a week for a duration of 6-12 weeks. back exercises that included:  Pelvic tilt/bracing instruction to focus on control of the pelvic girdle and lower abdominal muscles  Glute strengthening exercises, focusing on proper firing of the glutes without engaging the low back muscles Proper stretching techniques for maximum relief for the hamstrings, hip flexors, low back and some rotation where tolerated theraband given    Proper technique shown and discussed handout in great detail with ATC.  All  questions were discussed and answered.     Impression and Recommendations:     This case required medical decision making of moderate complexity.      Note: This dictation was prepared with Dragon dictation along with smaller phrase technology. Any transcriptional errors that result from this process are unintentional.

## 2017-08-22 ENCOUNTER — Encounter: Payer: Self-pay | Admitting: Family Medicine

## 2017-08-22 ENCOUNTER — Ambulatory Visit (INDEPENDENT_AMBULATORY_CARE_PROVIDER_SITE_OTHER): Payer: BLUE CROSS/BLUE SHIELD | Admitting: Family Medicine

## 2017-08-22 DIAGNOSIS — R293 Abnormal posture: Secondary | ICD-10-CM

## 2017-08-22 DIAGNOSIS — M999 Biomechanical lesion, unspecified: Secondary | ICD-10-CM | POA: Diagnosis not present

## 2017-08-22 DIAGNOSIS — M76891 Other specified enthesopathies of right lower limb, excluding foot: Secondary | ICD-10-CM

## 2017-08-22 MED ORDER — DICLOFENAC SODIUM 2 % TD SOLN: 2.0000 "application " | Freq: Two times a day (BID) | TRANSDERMAL | 3 refills | Status: DC

## 2017-08-22 NOTE — Assessment & Plan Note (Signed)
Poor posture noted. Discussed icing regimen, home exercises, which activities to do a which ones to avoid. Discussed which activities to do in which ones to avoid. Patient given adjustable standing does. Responded fairly well to a supine manipulation. Follow-up again with me in 4 weeks

## 2017-08-22 NOTE — Assessment & Plan Note (Signed)
Patient is some mild subluxation noted. Home exercises given, discussed compression, topical anti-inflammatory's prescribed. We discussed changing the stride length. We discussed possible shoes with running. Follow-up again in 4 weeks.

## 2017-08-22 NOTE — Assessment & Plan Note (Signed)
Decision today to treat with OMT was based on Physical Exam  After verbal consent patient was treated with HVLA, ME, FPR techniques in cervical, thoracic, rib lumbar and sacral areas  Patient tolerated the procedure well with improvement in symptoms  Patient given exercises, stretches and lifestyle modifications  See medications in patient instructions if given  Patient will follow up in 4 weeks 

## 2017-08-22 NOTE — Patient Instructions (Signed)
Good to see you  Ice 20 minutes 2 times daily. Usually after activity and before bed. Exercises 3 times a week.  pennsaid pinkie amount topically 2 times daily as needed.   We will get standing desk.  Over the counter vitamin D 2000 IU daily would be good.  Knee compression with running (try a sleeve) See me again in 4 weeks.

## 2017-09-19 ENCOUNTER — Ambulatory Visit (INDEPENDENT_AMBULATORY_CARE_PROVIDER_SITE_OTHER): Payer: BLUE CROSS/BLUE SHIELD | Admitting: Family Medicine

## 2017-09-19 ENCOUNTER — Encounter: Payer: Self-pay | Admitting: Family Medicine

## 2017-09-19 VITALS — BP 115/60 | HR 77 | Ht 72.0 in | Wt 156.0 lb

## 2017-09-19 DIAGNOSIS — M999 Biomechanical lesion, unspecified: Secondary | ICD-10-CM

## 2017-09-19 DIAGNOSIS — R293 Abnormal posture: Secondary | ICD-10-CM

## 2017-09-19 MED ORDER — DICLOFENAC SODIUM 2 % TD SOLN: 2.0000 "application " | Freq: Two times a day (BID) | TRANSDERMAL | 3 refills | Status: DC

## 2017-09-19 NOTE — Assessment & Plan Note (Signed)
Decision today to treat with OMT was based on Physical Exam  After verbal consent patient was treated with HVLA, ME, FPR techniques in cervical, thoracic, lumbar and sacral areas  Patient tolerated the procedure well with improvement in symptoms  Patient given exercises, stretches and lifestyle modifications  See medications in patient instructions if given  Patient will follow up in 4-6 weeks 

## 2017-09-19 NOTE — Patient Instructions (Signed)
Good to see you  Change your compression to thigh and shorten your stried We sent in the pennsaid again.  Exercises on wall.  Heel and butt touching.  Raise leg 6 inches and hold 2 seconds.  Down slow for count of 4 seconds.  1 set of 30 reps daily on both sides.  Change position at work every 2 hours  Ice is your friend See me again in 4-5 weeks.

## 2017-09-19 NOTE — Assessment & Plan Note (Signed)
Continuing to have been posture. Discussing in about ergonomics and the possibility of the adjustable standing desk. Patient is also going to do the exercises on a more regular routine. We discussed other potential things didn't change throughout the day including driving position. Responded well to osteopathic manipulation done. Following up in 4-6 weeks for further evaluation and treatment.

## 2017-09-19 NOTE — Progress Notes (Signed)
[name] [address], [place] [address] Phone: [phone] Subjective:       CC: Right knee pain follow-up, back pain follow-up  [id number]  Marvin [name] is a 41 y.o. male coming in with complaint of right knee pain. Found to have more of a popliteal tendinitis. Patient was to do compression, home exercises working on hamstring strengthening. Patient states Nearly completely resolved at this time. Running regularly.  Patient was also found to have more of a thoracic back pain. Seems to be more positional at work with stiffness in the mornings. Patient was to consider changing mattresses, working position, and did respond fairly well to osteopathic manipulation. Patient states overall seems to be doing well. Feels the manipulation home for 1-2 weeks and then started having increasing discomfort and pain again. Has not started using a standing desk and not doing the home exercises regularly.       Past Medical History:  Diagnosis Date  . Other and unspecified hyperlipidemia 01/18/2013   Past Surgical History:  Procedure Laterality Date  . APPENDECTOMY  2001   Social History   Social History  . Marital status: Married    Spouse name: N/A  . Number of children: N/A  . Years of education: masters   Occupational History  . Business    Social History Main Topics  . Smoking status: Never Smoker  . Smokeless tobacco: Never Used  . Alcohol use Yes     Comment: occasional social  . Drug use: No  . Sexual activity: Not Asked   Other Topics Concern  . None   Social History Narrative  . None   No Known Allergies Family History  Problem Relation Age of Onset  . Hyperlipidemia Other   . Hypertension Other   . Heart disease Other   . Diabetes Other   . Sudden death Other   . Prostate cancer Father      Past medical history, social, surgical and family history all reviewed in electronic medical record.  No pertanent  information unless stated regarding to the chief complaint.   Review of Systems:Review of systems updated and as accurate as of 09/19/17  No headache, visual changes, nausea, vomiting, diarrhea, constipation, dizziness, abdominal pain, skin rash, fevers, chills, night sweats, weight loss, swollen lymph nodes, body aches, joint swelling,  chest pain, shortness of breath, mood changes. Positive muscle aches  Objective  Blood pressure 115/60, pulse 77, height 6' (1.829 m), weight 156 lb (70.8 kg), SpO2 98 %. Systems examined below as of 09/19/17   General: No apparent distress alert and oriented x3 mood and affect normal, dressed appropriately.  HEENT: Pupils equal, extraocular movements intact  Respiratory: Patient's speak in full sentences and does not appear short of breath  Cardiovascular: No lower extremity edema, non tender, no erythema  Skin: Warm dry intact with no signs of infection or rash on extremities or on axial skeleton.  Abdomen: Soft nontender  Neuro: Cranial nerves II through XII are intact, neurovascularly intact in all extremities with 2+ DTRs and 2+ pulses.  Lymph: No lymphadenopathy of posterior or anterior cervical chain or axillae bilaterally.  Gait normal with good balance and coordination.  MSK:  Non tender with full range of motion and good stability and symmetric strength and tone of shoulders, elbows, wrist, hip, knee and ankles bilaterally.  Back Exam:  Inspection: Mild loss of lordosis Motion: Flexion 45 deg, Extension 25 deg, Side Bending to 30 deg bilaterally,  Rotation to 45 deg bilaterally  SLR laying: Negative  XSLR laying: Negative  Palpable tenderness: Tender to palpation in the paraspinal musculature still more in the thoracolumbar and somewhat in the lumbosacral. FABER: negative. Sensory change: Gross sensation intact to all lumbar and sacral dermatomes.  Reflexes: 2+ at both patellar tendons, 2+ at achilles tendons, Babinski's downgoing.  Strength at  foot  Plantar-flexion: 5/5 Dorsi-flexion: 5/5 Eversion: 5/5 Inversion: 5/5  Leg strength  Quad: 5/5 Hamstring: 5/5 Hip flexor: 5/5 Hip abductors: 5/5  Gait unremarkable.  Osteopathic findings C2 flexed rotated and side bent right C4 flexed rotated and side bent left C7 flexed rotated and side bent left T3 extended rotated and side bent right inhaled third rib T6 extended rotated and side bent left L2 flexed rotated and side bent right Sacrum right on right     Impression and Recommendations:     This case required medical decision making of moderate complexity.      Note: This dictation was prepared with Dragon dictation along with smaller phrase technology. Any transcriptional errors that result from this process are unintentional.

## 2017-10-17 ENCOUNTER — Encounter: Payer: Self-pay | Admitting: Internal Medicine

## 2017-10-17 ENCOUNTER — Ambulatory Visit (INDEPENDENT_AMBULATORY_CARE_PROVIDER_SITE_OTHER): Payer: BLUE CROSS/BLUE SHIELD | Admitting: Internal Medicine

## 2017-10-17 VITALS — BP 112/74 | Temp 98.7°F | Ht 72.0 in | Wt 155.0 lb

## 2017-10-17 DIAGNOSIS — H10021 Other mucopurulent conjunctivitis, right eye: Secondary | ICD-10-CM | POA: Diagnosis not present

## 2017-10-17 DIAGNOSIS — H109 Unspecified conjunctivitis: Secondary | ICD-10-CM | POA: Insufficient documentation

## 2017-10-17 MED ORDER — ERYTHROMYCIN 5 MG/GM OP OINT: 1.0000 "application " | TOPICAL_OINTMENT | Freq: Four times a day (QID) | OPHTHALMIC | 0 refills | Status: AC

## 2017-10-17 NOTE — Patient Instructions (Signed)
Please take all new medication as prescribed - the antibiotic  Please continue all other medications as before, and refills have been done if requested.  Please have the pharmacy call with any other refills you may need.  Please keep your appointments with your specialists as you may have planned   

## 2017-10-17 NOTE — Progress Notes (Signed)
   Subjective:    Patient ID: Marvin [name], male    DOB: 03/26/1976, 41 y.o.   MRN: [id number]  HPI      Here to f/u with c/o 1 wk waking with gunk in right eye, then more and more irritation and redness with eye discomfort and puffy eyelids, without high fever, HA, ST, sinus symptoms, cough, CP or SOB.  Actually feels some better today overall than yesterday but had been using topical gentamicin (old) drops he had at home, then ran out.  Pt denies new neurological symptoms such as vision change, new headache, or facial or extremity weakness or numbness   Pt denies polydipsia, polyuria.  No right left eye involvement Past Medical History:  Diagnosis Date  . Other and unspecified hyperlipidemia 01/18/2013   Past Surgical History:  Procedure Laterality Date  . APPENDECTOMY  2001    reports that  has never smoked. he has never used smokeless tobacco. He reports that he drinks alcohol. He reports that he does not use drugs. family history includes Diabetes in his other; Heart disease in his other; Hyperlipidemia in his other; Hypertension in his other; Prostate cancer in his father; Sudden death in his other. No Known Allergies Current Outpatient Medications on File Prior to Visit  Medication Sig Dispense Refill  . Diclofenac Sodium (PENNSAID) 2 % SOLN Place 2 application onto the skin 2 (two) times daily. 112 g 3   No current facility-administered medications on file prior to visit.    Review of Systems  Constitutional: Negative for other unusual diaphoresis or sweats HENT: Negative for ear discharge or swelling Eyes: Negative for other worsening visual disturbances Respiratory: Negative for stridor or other swelling  Gastrointestinal: Negative for worsening distension or other blood Genitourinary: Negative for retention or other urinary change Musculoskeletal: Negative for other MSK pain or swelling Skin: Negative for color change or other new lesions Neurological: Negative for worsening  tremors and other numbness  Psychiatric/Behavioral: Negative for worsening agitation or other fatigue All other system neg per pt    Objective:   Physical Exam BP 112/74   Temp 98.7 F (37.1 C) (Oral)   Ht 6' (1.829 m)   Wt 155 lb (70.3 kg)   SpO2 100%   BMI 21.02 kg/m  VS noted,  Constitutional: Pt appears in NAD HENT: Head: NCAT.  Right Ear: External ear normal.  Left Ear: External ear normal.  Eyes: . Pupils are equal, round, and reactive to light. Right Conjunctivae with marked erythema, slight d/c and EOM are normal, right upper and lower lids slightly puffy but not erythema or tender Nose: without d/c or deformity Neck: Neck supple. Gross normal ROM Cardiovascular: Normal rate and regular rhythm.   Pulmonary/Chest: Effort normal and breath sounds without rales or wheezing.  Neurological: Pt is alert. At baseline orientation, motor grossly intact Skin: Skin is warm. No rashes, other new lesions, no LE edema Psychiatric: Pt behavior is normal without agitation  No other exam findings    Assessment & Plan:

## 2017-10-18 NOTE — Progress Notes (Signed)
[name] [address], [place] [address] Phone: [phone] Subjective:     CC: Back pain follow-up  [id number]  Marvin [name] is a 41 y.o. male coming in with complaint of back pain follow-up.  Patient was found to have more muscle imbalances.  Had been responded to osteopathic manipulation.  Patient was encouraged to change ergonomics and do some home exercises.  Patient states he has had shoulder pain in his right shoulder for about 3 weeks. Proximal clavicle. He says that his muscles are sore both anterior and posterior. He says he was doing "dips" when he felt the pain.  Denies any radiation down the arm.     Past Medical History:  Diagnosis Date  . Other and unspecified hyperlipidemia 01/18/2013   Past Surgical History:  Procedure Laterality Date  . APPENDECTOMY  2001   Social History   Socioeconomic History  . Marital status: Married    Spouse name: None  . Number of children: None  . Years of education: masters  . Highest education level: None  Social Needs  . Financial resource strain: None  . Food insecurity - worry: None  . Food insecurity - inability: None  . Transportation needs - medical: None  . Transportation needs - non-medical: None  Occupational History  . Occupation: Business  Tobacco Use  . Smoking status: Never Smoker  . Smokeless tobacco: Never Used  Substance and Sexual Activity  . Alcohol use: Yes    Comment: occasional social  . Drug use: No  . Sexual activity: None  Other Topics Concern  . None  Social History Narrative  . None   No Known Allergies Family History  Problem Relation Age of Onset  . Hyperlipidemia Other   . Hypertension Other   . Heart disease Other   . Diabetes Other   . Sudden death Other   . Prostate cancer Father      Past medical history, social, surgical and family history all reviewed in electronic medical record.  No pertanent information unless stated regarding  to the chief complaint.   Review of Systems:Review of systems updated and as accurate as of 10/20/17  No headache, visual changes, nausea, vomiting, diarrhea, constipation, dizziness, abdominal pain, skin rash, fevers, chills, night sweats, weight loss, swollen lymph nodes, body aches, joint swelling, muscle aches, chest pain, shortness of breath, mood changes.   Objective  Blood pressure 110/70, pulse (!) 57, height 6' (1.829 m), weight 156 lb (70.8 kg), SpO2 98 %. Systems examined below as of 10/20/17   General: No apparent distress alert and oriented x3 mood and affect normal, dressed appropriately.  HEENT: Pupils equal, extraocular movements intact  Respiratory: Patient's speak in full sentences and does not appear short of breath  Cardiovascular: No lower extremity edema, non tender, no erythema  Skin: Warm dry intact with no signs of infection or rash on extremities or on axial skeleton.  Abdomen: Soft nontender  Neuro: Cranial nerves II through XII are intact, neurovascularly intact in all extremities with 2+ DTRs and 2+ pulses.  Lymph: No lymphadenopathy of posterior or anterior cervical chain or axillae bilaterally.  Gait normal with good balance and coordination.  MSK:  Non tender with full range of motion and good stability and symmetric strength and tone of shoulders, elbows, wrist, hip, knee and ankles bilaterally.  Patient's right clavicle mid clavicle does have an area that is tender to palpation.  Neck: Inspection mild increase in lordosis. No palpable  stepoffs. Negative Spurling's maneuver. Patient is actually some excessive extension Grip strength and sensation normal in bilateral hands Strength good C4 to T1 distribution No sensory change to C4 to T1 Negative Hoffman sign bilaterally Reflexes normal  Back exam shows some tightness over the sacroiliac joint mostly in the right side.  Positive Faber test.     Impression and Recommendations:     This case  required medical decision making of moderate complexity.      Note: This dictation was prepared with Dragon dictation along with smaller phrase technology. Any transcriptional errors that result from this process are unintentional.

## 2017-10-18 NOTE — Assessment & Plan Note (Signed)
Mild to mod, for antibx course,  to f/u any worsening symptoms or concerns 

## 2017-10-20 ENCOUNTER — Encounter: Payer: Self-pay | Admitting: Family Medicine

## 2017-10-20 ENCOUNTER — Ambulatory Visit (INDEPENDENT_AMBULATORY_CARE_PROVIDER_SITE_OTHER): Payer: BLUE CROSS/BLUE SHIELD | Admitting: Family Medicine

## 2017-10-20 VITALS — BP 110/70 | HR 57 | Ht 72.0 in | Wt 156.0 lb

## 2017-10-20 DIAGNOSIS — M999 Biomechanical lesion, unspecified: Secondary | ICD-10-CM

## 2017-10-20 DIAGNOSIS — R293 Abnormal posture: Secondary | ICD-10-CM | POA: Diagnosis not present

## 2017-10-20 MED ORDER — DICLOFENAC SODIUM 2 % TD SOLN: 2.0000 g | Freq: Two times a day (BID) | TRANSDERMAL | 3 refills | Status: DC

## 2017-10-20 MED ORDER — VITAMIN D (ERGOCALCIFEROL) 1.25 MG (50000 UNIT) PO CAPS: 50000.0000 [IU] | ORAL_CAPSULE | ORAL | 0 refills | Status: DC

## 2017-10-20 NOTE — Assessment & Plan Note (Signed)
Patient will continue to work on the posture.  We discussed ergonomics.  Patient did have some clavicle pain that seem to be different than usual.  We discussed what to potentially avoid for short course of time.  Patient will try to do this.  And follow-up with me again in 3-6 weeks

## 2017-10-20 NOTE — Assessment & Plan Note (Signed)
Decision today to treat with OMT was based on Physical Exam  After verbal consent patient was treated with HVLA, ME, FPR techniques in cervical, thoracic, lumbar and sacral areas  Patient tolerated the procedure well with improvement in symptoms  Patient given exercises, stretches and lifestyle modifications  See medications in patient instructions if given  Patient will follow up in 3-6 weeks 

## 2017-10-20 NOTE — Patient Instructions (Signed)
Good to see you  Try calling and see what happeng to the pennsaid Once weekly vitamin D for the small fracture on the clavicle Drop weight on lifting and stop dips See me again in 3-6 weeks to make sure it is healing.

## 2017-11-21 ENCOUNTER — Ambulatory Visit (INDEPENDENT_AMBULATORY_CARE_PROVIDER_SITE_OTHER): Payer: BLUE CROSS/BLUE SHIELD | Admitting: Family Medicine

## 2017-11-21 ENCOUNTER — Ambulatory Visit: Payer: Self-pay

## 2017-11-21 ENCOUNTER — Encounter: Payer: Self-pay | Admitting: Family Medicine

## 2017-11-21 VITALS — BP 114/78 | HR 65 | Ht 72.0 in | Wt 154.0 lb

## 2017-11-21 DIAGNOSIS — M999 Biomechanical lesion, unspecified: Secondary | ICD-10-CM | POA: Diagnosis not present

## 2017-11-21 DIAGNOSIS — M25511 Pain in right shoulder: Secondary | ICD-10-CM | POA: Diagnosis not present

## 2017-11-21 DIAGNOSIS — G8929 Other chronic pain: Secondary | ICD-10-CM | POA: Diagnosis not present

## 2017-11-21 DIAGNOSIS — R293 Abnormal posture: Secondary | ICD-10-CM

## 2017-11-21 NOTE — Assessment & Plan Note (Signed)
Decision today to treat with OMT was based on Physical Exam  After verbal consent patient was treated with HVLA, ME, FPR techniques in cervical, thoracic, lumbar and sacral areas  Patient tolerated the procedure well with improvement in symptoms  Patient given exercises, stretches and lifestyle modifications  See medications in patient instructions if given  Patient will follow up in 4-6 weeks 

## 2017-11-21 NOTE — Assessment & Plan Note (Signed)
Doing well at this time.  We discussed icing regimen and home exercises.  Continue to work on Engineer, building services at work.  Patient will follow up with me again in 4-8 weeks.

## 2017-11-21 NOTE — Progress Notes (Signed)
[name] [address], [place] [address] Phone: [phone] Subjective:     CC: Shoulder pain follow-up and back pain follow-up  [id number]  Marvin [name] is a 41 y.o. male coming in with complaint of shoulder pain.  Patient was seen previously and had what appeared to be a nonhealing fracture.  Started on once weekly vitamin D.  Patient states that the pain has improved significantly.  States that it feels like it is more stable overall.  Maybe even has increasing strength recently.       Past Medical History:  Diagnosis Date  . Other and unspecified hyperlipidemia 01/18/2013   Past Surgical History:  Procedure Laterality Date  . APPENDECTOMY  2001   Social History   Socioeconomic History  . Marital status: Married    Spouse name: Not on file  . Number of children: Not on file  . Years of education: masters  . Highest education level: Not on file  Social Needs  . Financial resource strain: Not on file  . Food insecurity - worry: Not on file  . Food insecurity - inability: Not on file  . Transportation needs - medical: Not on file  . Transportation needs - non-medical: Not on file  Occupational History  . Occupation: Business  Tobacco Use  . Smoking status: Never Smoker  . Smokeless tobacco: Never Used  Substance and Sexual Activity  . Alcohol use: Yes    Comment: occasional social  . Drug use: No  . Sexual activity: Not on file  Other Topics Concern  . Not on file  Social History Narrative  . Not on file   No Known Allergies Family History  Problem Relation Age of Onset  . Hyperlipidemia Other   . Hypertension Other   . Heart disease Other   . Diabetes Other   . Sudden death Other   . Prostate cancer Father      Past medical history, social, surgical and family history all reviewed in electronic medical record.  No pertanent information unless stated regarding to the chief complaint.   Review of  Systems:Review of systems updated and as accurate as of 11/21/17  No headache, visual changes, nausea, vomiting, diarrhea, constipation, dizziness, abdominal pain, skin rash, fevers, chills, night sweats, weight loss, swollen lymph nodes, body aches, joint swelling, muscle aches, chest pain, shortness of breath, mood changes.   Objective  Blood pressure 114/78, height 6' (1.829 m), weight 154 lb (69.9 kg). Systems examined below as of 11/21/17   General: No apparent distress alert and oriented x3 mood and affect normal, dressed appropriately.  HEENT: Pupils equal, extraocular movements intact  Respiratory: Patient's speak in full sentences and does not appear short of breath  Cardiovascular: No lower extremity edema, non tender, no erythema  Skin: Warm dry intact with no signs of infection or rash on extremities or on axial skeleton.  Abdomen: Soft nontender  Neuro: Cranial nerves II through XII are intact, neurovascularly intact in all extremities with 2+ DTRs and 2+ pulses.  Lymph: No lymphadenopathy of posterior or anterior cervical chain or axillae bilaterally.  Gait normal with good balance and coordination.  MSK:  Non tender with full range of motion and good stability and symmetric strength and tone of  elbows, wrist, hip, knee and ankles bilaterally.   Right shoulder exam shows the patient does have full strength, full range of motion, minimal signs of impingement.  Patient's clavicle does have what appears to be now  a callus formation over the mid clavicle area.  Nontender over it to palpation.  Neck exam shows some mild loss of lordosis.  Negative Spurling's.  Full strength of the extremities.  Patient does have some tightness between the scapulas bilaterally.  Musculoskeletal ultrasound was performed and interpreted by Lyndal Pulley  Limited musculoskeletal ultrasound of patient's clavicle shows patient does have callus formation over the area where there was initial stress  reaction previously.  Decreasing hypoechoic changes.  Osteopathic findings  C2 flexed rotated and side bent right C4 flexed rotated and side bent left C6 flexed rotated and side bent left T2 extended rotated and side bent right inhaled rib T7 extended rotated and side bent left L2 flexed rotated and side bent right Sacrum right on right    Impression and Recommendations:     This case required medical decision making of moderate complexity.      Note: This dictation was prepared with Dragon dictation along with smaller phrase technology. Any transcriptional errors that result from this process are unintentional.

## 2017-12-21 NOTE — Progress Notes (Signed)
[name] [address], [place] [address] Phone: [phone] Subjective:     CC:  back pain follow-up  [id number]  Marvin [name] is a 42 y.o. male coming in with complaint of back pain.  We have seen patient previously for thoracic back pain.  Had a right clavicle fracture and that seemed to be healing just well.  Back pain also seem to be doing well with osteopathic manipulation.  Continue to once weekly vitamin D.  Patient states that he does still feel some pain in his clavicle but states that it has gotten better. He is still having back pain that has not changed since last visit.   He also complains of pain in his left knee. He notes that most of his pain in on posterior lateral aspect of the knee over the hamstring tendon. His pain is only with running and the next day going down stairs.      Past Medical History:  Diagnosis Date  . Other and unspecified hyperlipidemia 01/18/2013   Past Surgical History:  Procedure Laterality Date  . APPENDECTOMY  2001   Social History   Socioeconomic History  . Marital status: Married    Spouse name: Not on file  . Number of children: Not on file  . Years of education: masters  . Highest education level: Not on file  Social Needs  . Financial resource strain: Not on file  . Food insecurity - worry: Not on file  . Food insecurity - inability: Not on file  . Transportation needs - medical: Not on file  . Transportation needs - non-medical: Not on file  Occupational History  . Occupation: Business  Tobacco Use  . Smoking status: Never Smoker  . Smokeless tobacco: Never Used  Substance and Sexual Activity  . Alcohol use: Yes    Comment: occasional social  . Drug use: No  . Sexual activity: Not on file  Other Topics Concern  . Not on file  Social History Narrative  . Not on file   No Known Allergies Family History  Problem Relation Age of Onset  . Hyperlipidemia Other   .  Hypertension Other   . Heart disease Other   . Diabetes Other   . Sudden death Other   . Prostate cancer Father      Past medical history, social, surgical and family history all reviewed in electronic medical record.  No pertanent information unless stated regarding to the chief complaint.   Review of Systems:Review of systems updated and as accurate as of 12/21/17  No headache, visual changes, nausea, vomiting, diarrhea, constipation, dizziness, abdominal pain, skin rash, fevers, chills, night sweats, weight loss, swollen lymph nodes, body aches, joint swelling, muscle aches, chest pain, shortness of breath, mood changes.   Objective  There were no vitals taken for this visit. Systems examined below as of 12/21/17   General: No apparent distress alert and oriented x3 mood and affect normal, dressed appropriately.  HEENT: Pupils equal, extraocular movements intact  Respiratory: Patient's speak in full sentences and does not appear short of breath  Cardiovascular: No lower extremity edema, non tender, no erythema  Skin: Warm dry intact with no signs of infection or rash on extremities or on axial skeleton.  Abdomen: Soft nontender  Neuro: Cranial nerves II through XII are intact, neurovascularly intact in all extremities with 2+ DTRs and 2+ pulses.  Lymph: No lymphadenopathy of posterior or anterior cervical chain or axillae bilaterally.  Gait  normal with good balance and coordination.  MSK:  Non tender with full range of motion and good stability and symmetric strength and tone of shoulders, elbows, wrist, hip, and ankles bilaterally.  Knee: Left Normal to inspection with no erythema or effusion or obvious bony abnormalities. Palpation normal with no warmth, joint line tenderness, patellar tenderness, or condyle tenderness.  Mild tenderness to palpation over the insertion of the hamstring tendon laterally ROM full in flexion and extension and lower leg rotation. Ligaments with solid  consistent endpoints including ACL, PCL, LCL, MCL. Negative Mcmurray's, Apley's, and Thessalonian tests. Non painful patellar compression. Patellar glide without crepitus. Patellar and quadriceps tendons unremarkable. Hamstring and quadriceps strength is normal.   Contralateral knee unremarkable  Back Exam:  Inspection: Mild loss of lordosis Motion: Flexion 45 deg, Extension 25 deg, Side Bending to 45 deg bilaterally,  Rotation to 45 deg bilaterally  SLR laying: Negative  XSLR laying: Negative  Palpable tenderness: Tender to palpation in the paraspinal musculature lumbar spine left greater than right. FABER: Mild positive right. Sensory change: Gross sensation intact to all lumbar and sacral dermatomes.  Reflexes: 2+ at both patellar tendons, 2+ at achilles tendons, Babinski's downgoing.  Strength at foot  Plantar-flexion: 5/5 Dorsi-flexion: 5/5 Eversion: 5/5 Inversion: 5/5  Leg strength  Quad: 5/5 Hamstring: 5/5 Hip flexor: 5/5 mild discomfort on the right side with testing hip abductors: 5/5  Gait unremarkable.  Osteopathic findings C2 flexed rotated and side bent right C4 flexed rotated and side bent left C7 flexed rotated and side bent left T3 extended rotated and side bent right inhaled third rib T6 extended rotated and side bent left L2 flexed rotated and side bent right Sacrum right on right     Impression and Recommendations:     This case required medical decision making of moderate complexity.      Note: This dictation was prepared with Dragon dictation along with smaller phrase technology. Any transcriptional errors that result from this process are unintentional.

## 2017-12-22 ENCOUNTER — Encounter: Payer: Self-pay | Admitting: Family Medicine

## 2017-12-22 ENCOUNTER — Ambulatory Visit (INDEPENDENT_AMBULATORY_CARE_PROVIDER_SITE_OTHER): Payer: BLUE CROSS/BLUE SHIELD | Admitting: Family Medicine

## 2017-12-22 VITALS — BP 110/66 | HR 74 | Ht 72.0 in | Wt 155.0 lb

## 2017-12-22 DIAGNOSIS — S76302A Unspecified injury of muscle, fascia and tendon of the posterior muscle group at thigh level, left thigh, initial encounter: Secondary | ICD-10-CM | POA: Diagnosis not present

## 2017-12-22 DIAGNOSIS — R293 Abnormal posture: Secondary | ICD-10-CM | POA: Diagnosis not present

## 2017-12-22 DIAGNOSIS — M999 Biomechanical lesion, unspecified: Secondary | ICD-10-CM | POA: Diagnosis not present

## 2017-12-22 NOTE — Assessment & Plan Note (Signed)
Decision today to treat with OMT was based on Physical Exam  After verbal consent patient was treated with HVLA, ME, FPR techniques in cervical, thoracic, rib, lumbar and sacral areas  Patient tolerated the procedure well with improvement in symptoms  Patient given exercises, stretches and lifestyle modifications  See medications in patient instructions if given  Patient will follow up in 6-8 weeks 

## 2017-12-22 NOTE — Patient Instructions (Signed)
Good to see you  Thigh compression could help  Shorten the stride a little.  Heel lift in the left shoe  Try to stretch the hip flexors after running as well.  See me again in 6 weeks.

## 2017-12-22 NOTE — Assessment & Plan Note (Signed)
Encourage patient to use a standing desk more on the right.  We discussed which activities to do which wants to avoid.  We discussed core stability.  Follow-up again in 6-8 weeks

## 2017-12-22 NOTE — Assessment & Plan Note (Signed)
Distal tendinitis.  We discussed home exercise, thigh compression, heel lift in his negative drop shoe.  Follow-up again 4 weeks

## 2018-01-09 ENCOUNTER — Other Ambulatory Visit: Payer: Self-pay | Admitting: Family Medicine

## 2018-02-01 NOTE — Progress Notes (Signed)
[name] [place], [place] [address] Phone: [phone] Subjective:     CC: Back and neck pain follow-up, right shoulder follow-up  [id number]  Marvin [name] is a 42 y.o. male coming in with complaint of hamstring pain.  Patient was making some mild improvement.  Still has some discomfort.  Patient states overall seems to be more some tightness.  Has responded well to manipulation. Discussed with patient again states that he is doing the exercises intermittently.  Patient was having right shoulder pain.  Found to have more of a nonhealing fracture of the clavicle previously.  Was doing 80% better 2 months ago but is having worsening symptoms again.  Was doing vitamin D at that point.  No longer doing it.  Has avoided all upper body exercises at this time.    Past Medical History:  Diagnosis Date  . Other and unspecified hyperlipidemia 01/18/2013   Past Surgical History:  Procedure Laterality Date  . APPENDECTOMY  2001   Social History   Socioeconomic History  . Marital status: Married    Spouse name: None  . Number of children: None  . Years of education: masters  . Highest education level: None  Social Needs  . Financial resource strain: None  . Food insecurity - worry: None  . Food insecurity - inability: None  . Transportation needs - medical: None  . Transportation needs - non-medical: None  Occupational History  . Occupation: Business  Tobacco Use  . Smoking status: Never Smoker  . Smokeless tobacco: Never Used  Substance and Sexual Activity  . Alcohol use: Yes    Comment: occasional social  . Drug use: No  . Sexual activity: None  Other Topics Concern  . None  Social History Narrative  . None   No Known Allergies Family History  Problem Relation Age of Onset  . Hyperlipidemia Other   . Hypertension Other   . Heart disease Other   . Diabetes Other   . Sudden death Other   . Prostate cancer Father       Past medical history, social, surgical and family history all reviewed in electronic medical record.  No pertanent information unless stated regarding to the chief complaint.   Review of Systems:Review of systems updated and as accurate as of 02/02/18  No headache, visual changes, nausea, vomiting, diarrhea, constipation, dizziness, abdominal pain, skin rash, fevers, chills, night sweats, weight loss, swollen lymph nodes, body aches, joint swelling, muscle aches, chest pain, shortness of breath, mood changes.   Objective  Blood pressure 100/60, pulse 83, height 6' (1.829 m), weight 156 lb (70.8 kg), SpO2 98 %. Systems examined below as of 02/02/18   General: No apparent distress alert and oriented x3 mood and affect normal, dressed appropriately.  HEENT: Pupils equal, extraocular movements intact  Respiratory: Patient's speak in full sentences and does not appear short of breath  Cardiovascular: No lower extremity edema, non tender, no erythema  Skin: Warm dry intact with no signs of infection or rash on extremities or on axial skeleton.  Abdomen: Soft nontender  Neuro: Cranial nerves II through XII are intact, neurovascularly intact in all extremities with 2+ DTRs and 2+ pulses.  Lymph: No lymphadenopathy of posterior or anterior cervical chain or axillae bilaterally.  Gait normal with good balance and coordination.  MSK:  Non tender with full range of motion and good stability and symmetric strength and tone of  elbows, wrist, hip, knee and ankles bilaterally.  Shoulder: Right Inspection reveals no abnormalities, atrophy or asymmetry. Palpation is normal with no tenderness over AC joint or bicipital groove.  More pain over the [facility] joint on the right  ROM is full in all planes. Rotator cuff strength normal throughout. Positive impingement very mild Speeds and Yergason's tests normal. No labral pathology noted with negative Obrien's, negative clunk and good stability. Normal  scapular function observed. No painful arc and no drop arm sign. No apprehension sign Contralateral shoulder unremarkable  MSK US performed of: Right shoulder This study was ordered, performed, and interpreted by Charlann Boxer D.O.  Shoulder:   Supraspinatus:  Appears normal on long and transverse views, no bursal bulge seen with shoulder abduction on impingement view. Infraspinatus:  Appears normal on long and transverse views. Subscapularis:  Appears normal on long and transverse views. Teres Minor:  Appears normal on long and transverse views. AC joint:  Capsule undistended, no geyser sign. Glenohumeral Joint:  Appears normal without effusion. Glenoid Labrum:  Intact without visualized tears. Biceps Tendon:  Appears normal on long and transverse views, no fraying of tendon, tendon located in intertubercular groove, no subluxation with shoulder internal or external rotation. No increased power doppler signal. [place] right side does have calcific changes noted.  Possible nonhealing fracture noted.   Impression: normal with possible nonhealing proximal clavicle fracture    Osteopathic findings C2 flexed rotated and side bent right C4 flexed rotated and side bent left C6 flexed rotated and side bent left T3 extended rotated and side bent right inhaled third rib T9 extended rotated and side bent left L2 flexed rotated and side bent right Sacrum right on right      Impression and Recommendations:     This case required medical decision making of moderate complexity.      Note: This dictation was prepared with Dragon dictation along with smaller phrase technology. Any transcriptional errors that result from this process are unintentional.

## 2018-02-02 ENCOUNTER — Encounter: Payer: Self-pay | Admitting: Family Medicine

## 2018-02-02 ENCOUNTER — Ambulatory Visit (INDEPENDENT_AMBULATORY_CARE_PROVIDER_SITE_OTHER): Payer: BLUE CROSS/BLUE SHIELD | Admitting: Family Medicine

## 2018-02-02 ENCOUNTER — Ambulatory Visit: Payer: Self-pay

## 2018-02-02 ENCOUNTER — Other Ambulatory Visit (INDEPENDENT_AMBULATORY_CARE_PROVIDER_SITE_OTHER): Payer: BLUE CROSS/BLUE SHIELD

## 2018-02-02 ENCOUNTER — Ambulatory Visit (INDEPENDENT_AMBULATORY_CARE_PROVIDER_SITE_OTHER)
Admission: RE | Admit: 2018-02-02 | Discharge: 2018-02-02 | Disposition: A | Discharge status: Home / Self Care | Payer: BLUE CROSS/BLUE SHIELD | Source: Ambulatory Visit | Attending: Family Medicine | Admitting: Family Medicine

## 2018-02-02 VITALS — BP 100/60 | HR 83 | Ht 72.0 in | Wt 156.0 lb

## 2018-02-02 DIAGNOSIS — M25511 Pain in right shoulder: Secondary | ICD-10-CM

## 2018-02-02 DIAGNOSIS — M2550 Pain in unspecified joint: Secondary | ICD-10-CM

## 2018-02-02 DIAGNOSIS — G8929 Other chronic pain: Secondary | ICD-10-CM

## 2018-02-02 DIAGNOSIS — R293 Abnormal posture: Secondary | ICD-10-CM | POA: Diagnosis not present

## 2018-02-02 DIAGNOSIS — M999 Biomechanical lesion, unspecified: Secondary | ICD-10-CM | POA: Diagnosis not present

## 2018-02-02 DIAGNOSIS — Z0000 Encounter for general adult medical examination without abnormal findings: Secondary | ICD-10-CM | POA: Diagnosis not present

## 2018-02-02 DIAGNOSIS — R072 Precordial pain: Secondary | ICD-10-CM | POA: Diagnosis not present

## 2018-02-02 LAB — CBC WITH DIFFERENTIAL/PLATELET
Basophils Absolute: 0 10*3/uL (ref 0.0–0.1)
Basophils Relative: 0.8 % (ref 0.0–3.0)
Eosinophils Absolute: 0.1 10*3/uL (ref 0.0–0.7)
Eosinophils Relative: 1.1 % (ref 0.0–5.0)
HCT: 45.1 % (ref 39.0–52.0)
Hemoglobin: 16.1 g/dL (ref 13.0–17.0)
Lymphocytes Relative: 32.9 % (ref 12.0–46.0)
Lymphs Abs: 1.9 10*3/uL (ref 0.7–4.0)
MCHC: 35.6 g/dL (ref 30.0–36.0)
MCV: 89.5 fl (ref 78.0–100.0)
Monocytes Absolute: 0.5 10*3/uL (ref 0.1–1.0)
Monocytes Relative: 8.5 % (ref 3.0–12.0)
Neutro Abs: 3.3 10*3/uL (ref 1.4–7.7)
Neutrophils Relative %: 56.7 % (ref 43.0–77.0)
Platelets: 184 10*3/uL (ref 150.0–400.0)
RBC: 5.05 Mil/uL (ref 4.22–5.81)
RDW: 13.1 % (ref 11.5–15.5)
WBC: 5.9 10*3/uL (ref 4.0–10.5)

## 2018-02-02 LAB — URINALYSIS, ROUTINE W REFLEX MICROSCOPIC
Bilirubin Urine: NEGATIVE
Hgb urine dipstick: NEGATIVE
Leukocytes, UA: NEGATIVE
Nitrite: NEGATIVE
RBC / HPF: NONE SEEN (ref 0–?)
Specific Gravity, Urine: 1.025 (ref 1.000–1.030)
Total Protein, Urine: NEGATIVE
Urine Glucose: NEGATIVE
Urobilinogen, UA: 0.2 (ref 0.0–1.0)
WBC, UA: NONE SEEN (ref 0–?)
pH: 6 (ref 5.0–8.0)

## 2018-02-02 LAB — BASIC METABOLIC PANEL
BUN: 14 mg/dL (ref 6–23)
CO2: 30 mEq/L (ref 19–32)
Calcium: 9.6 mg/dL (ref 8.4–10.5)
Chloride: 103 mEq/L (ref 96–112)
Creatinine, Ser: 0.85 mg/dL (ref 0.40–1.50)
GFR: 104.99 mL/min (ref >60.00)
Glucose, Bld: 87 mg/dL (ref 70–99)
Potassium: 4.2 mEq/L (ref 3.5–5.1)
Sodium: 139 mEq/L (ref 135–145)

## 2018-02-02 LAB — LIPID PANEL
Cholesterol: 184 mg/dL (ref 0–200)
HDL: 54.8 mg/dL (ref >39.00)
LDL Cholesterol: 115 mg/dL — ABNORMAL HIGH (ref 0–99)
NonHDL: 128.85
Total CHOL/HDL Ratio: 3
Triglycerides: 68 mg/dL (ref 0.0–149.0)
VLDL: 13.6 mg/dL (ref 0.0–40.0)

## 2018-02-02 LAB — PSA: PSA: 0.58 ng/mL (ref 0.10–4.00)

## 2018-02-02 LAB — IBC PANEL
Iron: 114 ug/dL (ref 42–165)
Saturation Ratios: 31.9 % (ref 20.0–50.0)
Transferrin: 255 mg/dL (ref 212.0–360.0)

## 2018-02-02 LAB — HEPATIC FUNCTION PANEL
ALT: 24 U/L (ref 0–53)
AST: 25 U/L (ref 0–37)
Albumin: 4 g/dL (ref 3.5–5.2)
Alkaline Phosphatase: 52 U/L (ref 39–117)
Bilirubin, Direct: 0.2 mg/dL (ref 0.0–0.3)
Total Bilirubin: 0.8 mg/dL (ref 0.2–1.2)
Total Protein: 6.4 g/dL (ref 6.0–8.3)

## 2018-02-02 LAB — SEDIMENTATION RATE: Sed Rate: 1 mm/hr (ref 0–15)

## 2018-02-02 LAB — URIC ACID: Uric Acid, Serum: 5.5 mg/dL (ref 4.0–7.8)

## 2018-02-02 LAB — TSH
TSH: 0.91 u[IU]/mL (ref 0.35–4.50)
TSH: 0.93 u[IU]/mL (ref 0.35–4.50)

## 2018-02-02 MED ORDER — VITAMIN D (ERGOCALCIFEROL) 1.25 MG (50000 UNIT) PO CAPS: 50000.0000 [IU] | ORAL_CAPSULE | ORAL | 0 refills | Status: DC

## 2018-02-02 MED ORDER — NITROGLYCERIN 0.2 MG/HR TD PT24: MEDICATED_PATCH | TRANSDERMAL | 1 refills | Status: DC

## 2018-02-02 NOTE — Patient Instructions (Addendum)
Good to see you  Alvera Singh is your friend.  Once weekly vitamin D for 12 weeks.  Nitroglycerin Protocol   Apply 1/4 nitroglycerin patch to affected area daily.  Change position of patch within the affected area every 24 hours.  You may experience a headache during the first 1-2 weeks of using the patch, these should subside.  If you experience headaches after beginning nitroglycerin patch treatment, you may take your preferred over the counter pain reliever.  Another side effect of the nitroglycerin patch is skin irritation or rash related to patch adhesive.  Please notify our office if you develop more severe headaches or rash, and stop the patch.  Tendon healing with nitroglycerin patch may require 12 to 24 weeks depending on the extent of injury.  Men should not use if taking Viagra, Cialis, or Levitra.   Do not use if you have migraines or rosacea.   We will get xrays and labs and see what comes back  See me again in 4 weeks

## 2018-02-02 NOTE — Assessment & Plan Note (Signed)
Still believe posture is likely contributing to most of the discomfort and pain.  Does have the area of pain still near the sternum.  Feel that laboratory workup is necessary with him having a potential nonhealing fracture as well as elevated liver enzymes previously.  Patient is in agreement with that plan.  Attempted osteopathic manipulation again.  Follow-up again in 4 weeks

## 2018-02-02 NOTE — Assessment & Plan Note (Signed)
Decision today to treat with OMT was based on Physical Exam  After verbal consent patient was treated with HVLA, ME, FPR techniques in cervical, thoracic, rib, lumbar and sacral areas  Patient tolerated the procedure well with improvement in symptoms  Patient given exercises, stretches and lifestyle modifications  See medications in patient instructions if given  Patient will follow up in 4 weeks 

## 2018-02-03 LAB — TESTOSTERONE, FREE, TOTAL, SHBG
Sex Hormone Binding: 51.5 nmol/L (ref 16.5–55.9)
Testosterone, Free: 21.4 pg/mL (ref 6.8–21.5)
Testosterone: 563 ng/dL (ref 264–916)

## 2018-02-05 LAB — VITAMIN D 1,25 DIHYDROXY
Vitamin D 1, 25 (OH)2 Total: 36 pg/mL (ref 18–72)
Vitamin D2 1, 25 (OH)2: 17 pg/mL
Vitamin D3 1, 25 (OH)2: 19 pg/mL

## 2018-02-05 LAB — PTH, INTACT AND CALCIUM
Calcium: 9.8 mg/dL (ref 8.6–10.3)
PTH: 44 pg/mL (ref 14–64)

## 2018-02-05 LAB — RHEUMATOID FACTOR: Rhuematoid fact SerPl-aCnc: <14 IU/mL (ref <14)

## 2018-02-05 LAB — ANA: Anti Nuclear Antibody(ANA): NEGATIVE

## 2018-02-13 ENCOUNTER — Encounter: Payer: BLUE CROSS/BLUE SHIELD | Admitting: Internal Medicine

## 2018-02-18 ENCOUNTER — Encounter: Payer: Self-pay | Admitting: Internal Medicine

## 2018-02-18 ENCOUNTER — Ambulatory Visit (INDEPENDENT_AMBULATORY_CARE_PROVIDER_SITE_OTHER): Payer: BLUE CROSS/BLUE SHIELD | Admitting: Internal Medicine

## 2018-02-18 VITALS — BP 116/76 | HR 68 | Temp 98.0°F | Ht 72.0 in | Wt 155.0 lb

## 2018-02-18 DIAGNOSIS — Z0000 Encounter for general adult medical examination without abnormal findings: Secondary | ICD-10-CM

## 2018-02-18 NOTE — Patient Instructions (Signed)
Please continue all other medications as before, and refills have been done if requested.  Please have the pharmacy call with any other refills you may need.  Please continue your efforts at being more active, low cholesterol diet, and weight control.  You are otherwise up to date with prevention measures today.  Please keep your appointments with your specialists as you may have planned  Please return in 1 year for your yearly visit, or sooner if needed, with Lab testing done 3-5 days before  

## 2018-02-18 NOTE — Progress Notes (Signed)
Subjective:    Patient ID: Marvin [name], male    DOB: 26-Sep-1976, 42 y.o.   MRN: [id number]  HPI Here for wellness and f/u;  Overall doing ok;  Pt denies Chest pain, worsening SOB, DOE, wheezing, orthopnea, PND, worsening LE edema, palpitations, dizziness or syncope.  Pt denies neurological change such as new headache, facial or extremity weakness.  Pt denies polydipsia, polyuria, or low sugar symptoms. Pt states overall good compliance with treatment and medications, good tolerability, and has been trying to follow appropriate diet.  Pt denies worsening depressive symptoms, suicidal ideation or panic. No fever, night sweats, wt loss, loss of appetite, or other constitutional symptoms.  Pt states good ability with ADL's, has low fall risk, home safety reviewed and adequate, no other significant changes in hearing or vision, and occasionally active with exercise. Mostly running more in the past yr, but right knee and upper back has been problematic with pain, sees sports medicine Wt Readings from Last 3 Encounters:  02/18/18 155 lb (70.3 kg)  02/02/18 156 lb (70.8 kg)  12/22/17 155 lb (70.3 kg)   Past Medical History:  Diagnosis Date  . Other and unspecified hyperlipidemia 01/18/2013   Past Surgical History:  Procedure Laterality Date  . APPENDECTOMY  2001    reports that  has never smoked. he has never used smokeless tobacco. He reports that he drinks alcohol. He reports that he does not use drugs. family history includes Diabetes in his other; Heart disease in his other; Hyperlipidemia in his other; Hypertension in his other; Prostate cancer in his father; Sudden death in his other. No Known Allergies Current Outpatient Medications on File Prior to Visit  Medication Sig Dispense Refill  . nitroGLYCERIN (NITRODUR - DOSED IN MG/24 HR) 0.2 mg/hr patch 1/4 patch daily 30 patch 1  . Vitamin D, Ergocalciferol, (DRISDOL) 50000 units CAPS capsule Take 1 capsule (50,000 Units total) by mouth every  7 (seven) days. 12 capsule 0   No current facility-administered medications on file prior to visit.    Review of Systems  Constitutional: Negative for other unusual diaphoresis or sweats HENT: Negative for ear discharge or swelling Eyes: Negative for other worsening visual disturbances Respiratory: Negative for stridor or other swelling  Gastrointestinal: Negative for worsening distension or other blood Genitourinary: Negative for retention or other urinary change Musculoskeletal: Negative for other MSK pain or swelling Skin: Negative for color change or other new lesions Neurological: Negative for worsening tremors and other numbness  Psychiatric/Behavioral: Negative for worsening agitation or other fatigue All other system neg per pt    Objective:   Physical Exam BP 116/76   Pulse 68   Temp 98 F (36.7 C) (Oral)   Ht 6' (1.829 m)   Wt 155 lb (70.3 kg)   SpO2 99%   BMI 21.02 kg/m  VS noted,  Constitutional: Pt is oriented to person, place, and time. Appears well-developed and well-nourished, in no significant distress and comfortable Head: Normocephalic and atraumatic  Eyes: Conjunctivae and EOM are normal. Pupils are equal, round, and reactive to light Right Ear: External ear normal without discharge Left Ear: External ear normal without discharge Nose: Nose without discharge or deformity Mouth/Throat: Oropharynx is without other ulcerations and moist  Neck: Normal range of motion. Neck supple. No JVD present. No tracheal deviation present or significant neck LA or mass Cardiovascular: Normal rate, regular rhythm, normal heart sounds and intact distal pulses.  Pulmonary/Chest: WOB normal and breath sounds without rales or wheezing  Abdominal:  Soft. Bowel sounds are normal. NT. No HSM  Musculoskeletal: Normal range of motion. Exhibits no edema Lymphadenopathy: Has no other cervical adenopathy.  Neurological: Pt is alert and oriented to person, place, and time. Pt has normal  reflexes. No cranial nerve deficit. Motor grossly intact, Gait intact Skin: Skin is warm and dry. No rash noted or new ulcerations Psychiatric:  Has normal mood and affect. Behavior is normal without agitation No other exam findings Lab Results  Component Value Date   WBC 5.9 02/02/2018   HGB 16.1 02/02/2018   HCT 45.1 02/02/2018   PLT 184.0 02/02/2018   GLUCOSE 87 02/02/2018   CHOL 184 02/02/2018   TRIG 68.0 02/02/2018   HDL 54.80 02/02/2018   LDLDIRECT 114.1 01/18/2013   LDLCALC 115 (H) 02/02/2018   ALT 24 02/02/2018   AST 25 02/02/2018   NA 139 02/02/2018   K 4.2 02/02/2018   CL 103 02/02/2018   CREATININE 0.85 02/02/2018   BUN 14 02/02/2018   CO2 30 02/02/2018   TSH 0.93 02/02/2018   PSA 0.58 02/02/2018       Assessment & Plan:

## 2018-02-18 NOTE — Assessment & Plan Note (Signed)

## 2018-03-02 NOTE — Progress Notes (Signed)
[name] [place], [place] [address] Phone: [phone] Subjective:     CC: Neck pain and shoulder pain follow-up  [id number]  Marvin [name] is a 42 y.o. male coming in with complaint of neck and shoulder pain.  Found to have a nonhealing clavicle fracture.  Started on nitroglycerin.  May be some mild improvement.  Does respond well to manipulation for the neck and upper back pain.  Has made changes at work and ergonomics and seems to be slowly improving.  Still can have significant amount of pain daily but nothing that stops him from activities.      Past Medical History:  Diagnosis Date  . Other and unspecified hyperlipidemia 01/18/2013   Past Surgical History:  Procedure Laterality Date  . APPENDECTOMY  2001   Social History   Socioeconomic History  . Marital status: Married    Spouse name: Not on file  . Number of children: Not on file  . Years of education: masters  . Highest education level: Not on file  Occupational History  . Occupation: Business  Social Needs  . Financial resource strain: Not on file  . Food insecurity:    Worry: Not on file    Inability: Not on file  . Transportation needs:    Medical: Not on file    Non-medical: Not on file  Tobacco Use  . Smoking status: Never Smoker  . Smokeless tobacco: Never Used  Substance and Sexual Activity  . Alcohol use: Yes    Comment: occasional social  . Drug use: No  . Sexual activity: Not on file  Lifestyle  . Physical activity:    Days per week: Not on file    Minutes per session: Not on file  . Stress: Not on file  Relationships  . Social connections:    Talks on phone: Not on file    Gets together: Not on file    Attends religious service: Not on file    Active member of club or organization: Not on file    Attends meetings of clubs or organizations: Not on file    Relationship status: Not on file  Other Topics Concern  . Not on file  Social  History Narrative  . Not on file   No Known Allergies Family History  Problem Relation Age of Onset  . Hyperlipidemia Other   . Hypertension Other   . Heart disease Other   . Diabetes Other   . Sudden death Other   . Prostate cancer Father      Past medical history, social, surgical and family history all reviewed in electronic medical record.  No pertanent information unless stated regarding to the chief complaint.   Review of Systems:Review of systems updated and as accurate as of 03/02/18  No headache, visual changes, nausea, vomiting, diarrhea, constipation, dizziness, abdominal pain, skin rash, fevers, chills, night sweats, weight loss, swollen lymph nodes, body aches, joint swelling, muscle aches, chest pain, shortness of breath, mood changes.   Objective  There were no vitals taken for this visit. Systems examined below as of 03/02/18   General: No apparent distress alert and oriented x3 mood and affect normal, dressed appropriately.  HEENT: Pupils equal, extraocular movements intact  Respiratory: Patient's speak in full sentences and does not appear short of breath  Cardiovascular: No lower extremity edema, non tender, no erythema  Skin: Warm dry intact with no signs of infection or rash on extremities or on axial  skeleton.  Abdomen: Soft nontender  Neuro: Cranial nerves II through XII are intact, neurovascularly intact in all extremities with 2+ DTRs and 2+ pulses.  Lymph: No lymphadenopathy of posterior or anterior cervical chain or axillae bilaterally.  Gait normal with good balance and coordination.  MSK:  Non tender with full range of motion and good stability and symmetric strength and tone of shoulders, elbows, wrist, hip, knee and ankles bilaterally.  Neck: Inspection unremarkable. No palpable stepoffs. Negative Spurling's maneuver. Full neck range of motion very mild tightness on the right side of the neck with left-sided rotation Grip strength and sensation  normal in bilateral hands Strength good C4 to T1 distribution No sensory change to C4 to T1 Negative Hoffman sign bilaterally Reflexes normal Tightness right trapezius.  Patient still has some tenderness over the clavicle bone.  Nitroglycerin patches in place.  Osteopathic findings  C2 flexed rotated and side bent right C4 flexed rotated and side bent left C6 flexed rotated and side bent right  T3 extended rotated and side bent right inhaled third rib T8 extended rotated and side bent left L2 flexed rotated and side bent right Sacrum right on right    Impression and Recommendations:     This case required medical decision making of moderate complexity.      Note: This dictation was prepared with Dragon dictation along with smaller phrase technology. Any transcriptional errors that result from this process are unintentional.

## 2018-03-03 ENCOUNTER — Encounter: Payer: Self-pay | Admitting: Family Medicine

## 2018-03-03 ENCOUNTER — Ambulatory Visit: Payer: BLUE CROSS/BLUE SHIELD | Admitting: Family Medicine

## 2018-03-03 VITALS — BP 100/68 | HR 59 | Ht 72.0 in | Wt 155.0 lb

## 2018-03-03 DIAGNOSIS — R293 Abnormal posture: Secondary | ICD-10-CM | POA: Diagnosis not present

## 2018-03-03 DIAGNOSIS — M999 Biomechanical lesion, unspecified: Secondary | ICD-10-CM | POA: Diagnosis not present

## 2018-03-03 NOTE — Assessment & Plan Note (Signed)
Decision today to treat with OMT was based on Physical Exam  After verbal consent patient was treated with HVLA, ME, FPR techniques in cervical, thoracic, lumbar and sacral areas  Patient tolerated the procedure well with improvement in symptoms  Patient given exercises, stretches and lifestyle modifications  See medications in patient instructions if given  Patient will follow up in 4-8 weeks 

## 2018-03-03 NOTE — Patient Instructions (Signed)
Good to see you  Lets watch the posture New homework 3 times a week  OK to start working out more regularly but keep hands within peripheral vision.  See me again In 4 weeks

## 2018-03-03 NOTE — Assessment & Plan Note (Signed)
Do believe that this is likely more secondary to most of the pain in discomfort the patient is having.  Continue all conservative therapy at this time.  Discussed icing regimen and home exercises.  Discussed which activities to do which wants to avoid.  Increase activity as tolerated.  Follow-up again in 4-8 weeks

## 2018-04-02 ENCOUNTER — Ambulatory Visit (INDEPENDENT_AMBULATORY_CARE_PROVIDER_SITE_OTHER): Payer: BLUE CROSS/BLUE SHIELD | Admitting: Family Medicine

## 2018-04-02 ENCOUNTER — Encounter: Payer: Self-pay | Admitting: Family Medicine

## 2018-04-02 VITALS — BP 120/72 | HR 70 | Wt 159.0 lb

## 2018-04-02 DIAGNOSIS — S4991XA Unspecified injury of right shoulder and upper arm, initial encounter: Secondary | ICD-10-CM | POA: Diagnosis not present

## 2018-04-02 DIAGNOSIS — R293 Abnormal posture: Secondary | ICD-10-CM

## 2018-04-02 DIAGNOSIS — M999 Biomechanical lesion, unspecified: Secondary | ICD-10-CM

## 2018-04-02 NOTE — Progress Notes (Signed)
[name] [place], [place] [address] Phone: [phone] Subjective:      CC: neck and shoulder pain.   [id number]  Marvin [name] is a 42 y.o. male coming in with complaint of neck and shoulder pain. Patient has been having neck pain. He did well after the adjustment and then had some pain after two weeks but that has gone away. He notes that he may have hurt his right proximal clavicle. He was on the monkey bars with this kids and felt a pop. Has full ROM of shoulder. No longer painful.  Patient states mild discomfort overall but overall feeling like he has been making progress.     Past Medical History:  Diagnosis Date  . Other and unspecified hyperlipidemia 01/18/2013   Past Surgical History:  Procedure Laterality Date  . APPENDECTOMY  2001   Social History   Socioeconomic History  . Marital status: Married    Spouse name: Not on file  . Number of children: Not on file  . Years of education: masters  . Highest education level: Not on file  Occupational History  . Occupation: Business  Social Needs  . Financial resource strain: Not on file  . Food insecurity:    Worry: Not on file    Inability: Not on file  . Transportation needs:    Medical: Not on file    Non-medical: Not on file  Tobacco Use  . Smoking status: Never Smoker  . Smokeless tobacco: Never Used  Substance and Sexual Activity  . Alcohol use: Yes    Comment: occasional social  . Drug use: No  . Sexual activity: Not on file  Lifestyle  . Physical activity:    Days per week: Not on file    Minutes per session: Not on file  . Stress: Not on file  Relationships  . Social connections:    Talks on phone: Not on file    Gets together: Not on file    Attends religious service: Not on file    Active member of club or organization: Not on file    Attends meetings of clubs or organizations: Not on file    Relationship status: Not on file  Other Topics  Concern  . Not on file  Social History Narrative  . Not on file   No Known Allergies Family History  Problem Relation Age of Onset  . Hyperlipidemia Other   . Hypertension Other   . Heart disease Other   . Diabetes Other   . Sudden death Other   . Prostate cancer Father      Past medical history, social, surgical and family history all reviewed in electronic medical record.  No pertanent information unless stated regarding to the chief complaint.   Review of Systems:Review of systems updated and as accurate as of 04/02/18  No headache, visual changes, nausea, vomiting, diarrhea, constipation, dizziness, abdominal pain, skin rash, fevers, chills, night sweats, weight loss, swollen lymph nodes, body aches, joint swelling,  chest pain, shortness of breath, mood changes.  Positive muscle aches  Objective  Blood pressure 120/72, pulse 70, weight 159 lb (72.1 kg), SpO2 98 %. Systems examined below as of 04/02/18   General: No apparent distress alert and oriented x3 mood and affect normal, dressed appropriately.  HEENT: Pupils equal, extraocular movements intact  Respiratory: Patient's speak in full sentences and does not appear short of breath  Cardiovascular: No lower extremity edema, non tender, no erythema  Skin: Warm dry intact with no signs of infection or rash on extremities or on axial skeleton.  Abdomen: Soft nontender  Neuro: Cranial nerves II through XII are intact, neurovascularly intact in all extremities with 2+ DTRs and 2+ pulses.  Lymph: No lymphadenopathy of posterior or anterior cervical chain or axillae bilaterally.  Gait normal with good balance and coordination.  MSK:  Non tender with full range of motion and good stability and symmetric strength and tone of shoulders, elbows, wrist, hip, knee and ankles bilaterally.  Patient's right shoulder shows that he does have some increasing discomfort over the [place] joint in the proximal clavicle again.  No significant new  swelling no noted.  Full range of motion of the shoulder.  No significant impingement.  Neck: Inspection mild loss of lordosis. No palpable stepoffs. Negative Spurling's maneuver. Patient lacks last 5 degrees of rotation bilaterally Grip strength and sensation normal in bilateral hands Strength good C4 to T1 distribution No sensory change to C4 to T1 Negative Hoffman sign bilaterally Reflexes normal Tightness of the trapezius bilaterally  Low back shows some tenderness to palpation over the sacroiliac joints bilaterally right greater than left.  Mild discomfort with the [facility] test but negative for any radicular symptoms.  Negative straight leg test.  Osteopathic findings C2 flexed rotated and side bent right C4 flexed rotated and side bent left T3 extended rotated and side bent right inhaled third rib L3 flexed rotated and side bent right Sacrum right on right     Impression and Recommendations:     This case required medical decision making of moderate complexity.      Note: This dictation was prepared with Dragon dictation along with smaller phrase technology. Any transcriptional errors that result from this process are unintentional.

## 2018-04-02 NOTE — Patient Instructions (Addendum)
monkey bars are not for adults.  K2 add daily for 1 month When out of the vitamin D then go to 4000 IU daily for 1 month then 2000 IU daily thereafter Stay active See me again in 6 weeks

## 2018-04-02 NOTE — Assessment & Plan Note (Signed)
Decision today to treat with OMT was based on Physical Exam  After verbal consent patient was treated with HVLA, ME, FPR techniques in cervical, thoracic, lumbar and sacral areas  Patient tolerated the procedure well with improvement in symptoms  Patient given exercises, stretches and lifestyle modifications  See medications in patient instructions if given  Patient will follow up in 6 weeks 

## 2018-04-02 NOTE — Assessment & Plan Note (Signed)
Continues to have some difficulty with the posture.  Patient is using a standing desk.  Discussed ergonomics at home exercises.  Discussed which activities of doing which wants to avoid.  Patient is to increase activity as tolerated.  Follow-up with me again 6 weeks

## 2018-04-02 NOTE — Assessment & Plan Note (Signed)
Discussed which activities of doing which wants to avoid.  We discussed posture and ergonomics.  Discussed once weekly vitamin D.  Discussed avoiding certain activities.  Follow-up again 2 to 3 weeks.

## 2018-05-14 NOTE — Progress Notes (Signed)
[name] [place], [place] [address] Phone: [phone] Subjective:     CC: back pain   [id number]  Marvin [name] is a 42 y.o. male coming in with complaint of back pain. He states that he has good and bad days. Today he is having a good day. Has had previous success with OMT.  Patient states overall seems to be doing well.  Not having significant difficulties overall though.     Past Medical History:  Diagnosis Date  . Other and unspecified hyperlipidemia 01/18/2013   Past Surgical History:  Procedure Laterality Date  . APPENDECTOMY  2001   Social History   Socioeconomic History  . Marital status: Married    Spouse name: Not on file  . Number of children: Not on file  . Years of education: masters  . Highest education level: Not on file  Occupational History  . Occupation: Business  Social Needs  . Financial resource strain: Not on file  . Food insecurity:    Worry: Not on file    Inability: Not on file  . Transportation needs:    Medical: Not on file    Non-medical: Not on file  Tobacco Use  . Smoking status: Never Smoker  . Smokeless tobacco: Never Used  Substance and Sexual Activity  . Alcohol use: Yes    Comment: occasional social  . Drug use: No  . Sexual activity: Not on file  Lifestyle  . Physical activity:    Days per week: Not on file    Minutes per session: Not on file  . Stress: Not on file  Relationships  . Social connections:    Talks on phone: Not on file    Gets together: Not on file    Attends religious service: Not on file    Active member of club or organization: Not on file    Attends meetings of clubs or organizations: Not on file    Relationship status: Not on file  Other Topics Concern  . Not on file  Social History Narrative  . Not on file   No Known Allergies Family History  Problem Relation Age of Onset  . Hyperlipidemia Other   . Hypertension Other   . Heart disease Other    . Diabetes Other   . Sudden death Other   . Prostate cancer Father      Past medical history, social, surgical and family history all reviewed in electronic medical record.  No pertanent information unless stated regarding to the chief complaint.   Review of Systems:Review of systems updated and as accurate as of 05/15/18  No headache, visual changes, nausea, vomiting, diarrhea, constipation, dizziness, abdominal pain, skin rash, fevers, chills, night sweats, weight loss, swollen lymph nodes, body aches, joint swelling, muscle aches, chest pain, shortness of breath, mood changes.   Objective  Blood pressure 118/78, pulse 79, height 6' (1.829 m), weight 157 lb (71.2 kg), SpO2 97 %. Systems examined below as of 05/15/18   General: No apparent distress alert and oriented x3 mood and affect normal, dressed appropriately.  HEENT: Pupils equal, extraocular movements intact  Respiratory: Patient's speak in full sentences and does not appear short of breath  Cardiovascular: No lower extremity edema, non tender, no erythema  Skin: Warm dry intact with no signs of infection or rash on extremities or on axial skeleton.  Abdomen: Soft nontender  Neuro: Cranial nerves II through XII are intact, neurovascularly intact in all extremities with  2+ DTRs and 2+ pulses.  Lymph: No lymphadenopathy of posterior or anterior cervical chain or axillae bilaterally.  Gait normal with good balance and coordination.  MSK:  Non tender with full range of motion and good stability and symmetric strength and tone of shoulders, elbows, wrist, hip, knee and ankles bilaterally. Neck: Inspection mild loss of lordosis. No palpable stepoffs. Negative Spurling's maneuver. Nearly full range of motion lacking last 5 degrees of sidebending and rotation to the right Grip strength and sensation normal in bilateral hands Strength good C4 to T1 distribution No sensory change to C4 to T1 Negative Hoffman sign  bilaterally Reflexes normal Mild tightness of the trapezius   Back Exam:  Inspection: Unremarkable  Motion: Flexion 40 deg, Extension 25 deg, Side Bending to 35 deg bilaterally,  Rotation to 45 deg bilaterally  SLR laying: Negative  XSLR laying: Negative  Palpable tenderness: Tender to palpation of the paraspinal musculature mildly over the right sacroiliac joint. FABER: negative. Sensory change: Gross sensation intact to all lumbar and sacral dermatomes.  Reflexes: 2+ at both patellar tendons, 2+ at achilles tendons, Babinski's downgoing.  Strength at foot  Plantar-flexion: 5/5 Dorsi-flexion: 5/5 Eversion: 5/5 Inversion: 5/5  Leg strength  Quad: 5/5 Hamstring: 5/5 Hip flexor: 5/5 Hip abductors: 5/5  Gait unremarkable.  Osteopathic findings C4 flexed rotated and side bent right  C6 flexed rotated and side bent left T3 extended rotated and side bent right inhaled third rib L2 flexed rotated and side bent right Sacrum right on right    Impression and Recommendations:     This case required medical decision making of moderate complexity.      Note: This dictation was prepared with Dragon dictation along with smaller phrase technology. Any transcriptional errors that result from this process are unintentional.

## 2018-05-15 ENCOUNTER — Ambulatory Visit (INDEPENDENT_AMBULATORY_CARE_PROVIDER_SITE_OTHER): Payer: BLUE CROSS/BLUE SHIELD | Admitting: Family Medicine

## 2018-05-15 ENCOUNTER — Encounter: Payer: Self-pay | Admitting: Family Medicine

## 2018-05-15 VITALS — BP 118/78 | HR 79 | Ht 72.0 in | Wt 157.0 lb

## 2018-05-15 DIAGNOSIS — M999 Biomechanical lesion, unspecified: Secondary | ICD-10-CM | POA: Diagnosis not present

## 2018-05-15 DIAGNOSIS — R293 Abnormal posture: Secondary | ICD-10-CM | POA: Diagnosis not present

## 2018-05-15 NOTE — Patient Instructions (Signed)
Good to see you  Marvin [name] is your friend.  You are doing great! See me again in 6-7 weeks

## 2018-05-15 NOTE — Assessment & Plan Note (Signed)
Decision today to treat with OMT was based on Physical Exam  After verbal consent patient was treated with HVLA, ME, FPR techniques in cervical, thoracic, lumbar and sacral areas  Patient tolerated the procedure well with improvement in symptoms  Patient given exercises, stretches and lifestyle modifications  See medications in patient instructions if given  Patient will follow up in 6-8 weeks 

## 2018-05-15 NOTE — Assessment & Plan Note (Signed)
Remarkable improvement overall.  Discussed the possibility of increasing strength training that I think would be beneficial.  Discussed icing regimen and home exercises.  Discussed which activities to do which was to avoid.  Follow-up again in 6 to 8 weeks

## 2018-06-27 NOTE — Progress Notes (Signed)
[name] [place], [place] [address] Phone: [phone] Subjective:    CC: Neck and back pain  [id number]  Marvin [name] is a 42 y.o. male coming in with complaint of back pain. He threw out his back playing with his son the other day he states. He also mentions that his right shoulder pain over the [facility] joint area. Not sure how he injured shoulder but it has been bothering him since yesterday. Denies any radiating symptoms. Is using Nitro patches on shoulder. Patient is in some mild discomfort overall but nothing severe.     Past Medical History:  Diagnosis Date  . Other and unspecified hyperlipidemia 01/18/2013   Past Surgical History:  Procedure Laterality Date  . APPENDECTOMY  2001   Social History   Socioeconomic History  . Marital status: Married    Spouse name: Not on file  . Number of children: Not on file  . Years of education: masters  . Highest education level: Not on file  Occupational History  . Occupation: Business  Social Needs  . Financial resource strain: Not on file  . Food insecurity:    Worry: Not on file    Inability: Not on file  . Transportation needs:    Medical: Not on file    Non-medical: Not on file  Tobacco Use  . Smoking status: Never Smoker  . Smokeless tobacco: Never Used  Substance and Sexual Activity  . Alcohol use: Yes    Comment: occasional social  . Drug use: No  . Sexual activity: Not on file  Lifestyle  . Physical activity:    Days per week: Not on file    Minutes per session: Not on file  . Stress: Not on file  Relationships  . Social connections:    Talks on phone: Not on file    Gets together: Not on file    Attends religious service: Not on file    Active member of club or organization: Not on file    Attends meetings of clubs or organizations: Not on file    Relationship status: Not on file  Other Topics Concern  . Not on file  Social History Narrative  . Not on  file   No Known Allergies Family History  Problem Relation Age of Onset  . Hyperlipidemia Other   . Hypertension Other   . Heart disease Other   . Diabetes Other   . Sudden death Other   . Prostate cancer Father      Past medical history, social, surgical and family history all reviewed in electronic medical record.  No pertanent information unless stated regarding to the chief complaint.   Review of Systems:Review of systems updated and as accurate as of 06/29/18  No headache, visual changes, nausea, vomiting, diarrhea, constipation, dizziness, abdominal pain, skin rash, fevers, chills, night sweats, weight loss, swollen lymph nodes, body aches, joint swelling, muscle aches, chest pain, shortness of breath, mood changes.   Objective  Blood pressure 112/70, pulse 68, height 6' (1.829 m), weight 159 lb (72.1 kg), SpO2 98 %. Systems examined below as of 06/29/18   General: No apparent distress alert and oriented x3 mood and affect normal, dressed appropriately.  HEENT: Pupils equal, extraocular movements intact  Respiratory: Patient's speak in full sentences and does not appear short of breath  Cardiovascular: No lower extremity edema, non tender, no erythema  Skin: Warm dry intact with no signs of infection or rash on extremities  or on axial skeleton.  Abdomen: Soft nontender  Neuro: Cranial nerves II through XII are intact, neurovascularly intact in all extremities with 2+ DTRs and 2+ pulses.  Lymph: No lymphadenopathy of posterior or anterior cervical chain or axillae bilaterally.  Gait normal with good balance and coordination.  MSK:  Non tender with full range of motion and good stability and symmetric strength and tone of shoulders, elbows, wrist, hip, knee and ankles bilaterally.  Neck: Inspection loss of lordosis. No palpable stepoffs. Negative Spurling's maneuver. Limited range of motion with side bending to the left and rotation to the right Grip strength and sensation  normal in bilateral hands Strength good C4 to T1 distribution No sensory change to C4 to T1 Negative Hoffman sign bilaterally Reflexes normal  Osteopathic findings C2 flexed rotated and side bent right C4 flexed rotated and side bent left C6 flexed rotated and side bent left T3 extended rotated and side bent right inhaled third rib T9 extended rotated and side bent left L3 flexed rotated and side bent right Sacrum right on right     Impression and Recommendations:     This case required medical decision making of moderate complexity.      Note: This dictation was prepared with Dragon dictation along with smaller phrase technology. Any transcriptional errors that result from this process are unintentional.

## 2018-06-29 ENCOUNTER — Encounter: Payer: Self-pay | Admitting: Family Medicine

## 2018-06-29 ENCOUNTER — Ambulatory Visit (INDEPENDENT_AMBULATORY_CARE_PROVIDER_SITE_OTHER): Payer: BLUE CROSS/BLUE SHIELD | Admitting: Family Medicine

## 2018-06-29 VITALS — BP 112/70 | HR 68 | Ht 72.0 in | Wt 159.0 lb

## 2018-06-29 DIAGNOSIS — M999 Biomechanical lesion, unspecified: Secondary | ICD-10-CM | POA: Diagnosis not present

## 2018-06-29 DIAGNOSIS — R293 Abnormal posture: Secondary | ICD-10-CM | POA: Diagnosis not present

## 2018-06-29 MED ORDER — NITROGLYCERIN 0.2 MG/HR TD PT24: MEDICATED_PATCH | TRANSDERMAL | 1 refills | Status: DC

## 2018-06-29 MED ORDER — VITAMIN D (ERGOCALCIFEROL) 1.25 MG (50000 UNIT) PO CAPS: 50000.0000 [IU] | ORAL_CAPSULE | ORAL | 0 refills | Status: DC

## 2018-06-29 NOTE — Patient Instructions (Signed)
Good to see you  Alvera Singh is your friend.  Refilled the nitro and the vitamin D  See me again in 5-6 weeks

## 2018-06-29 NOTE — Assessment & Plan Note (Signed)
Decision today to treat with OMT was based on Physical Exam  After verbal consent patient was treated with HVLA, ME, FPR techniques in cervical, thoracic, lumbar and sacral areas  Patient tolerated the procedure well with improvement in symptoms  Patient given exercises, stretches and lifestyle modifications  See medications in patient instructions if given  Patient will follow up in 4-8 weeks 

## 2018-06-29 NOTE — Assessment & Plan Note (Signed)
Discussed again with patient poor posture.  We discussed ergonomics, home exercise, which activities to do which wants to avoid.  Patient is to increase activity slowly over the course the next several days.  Follow-up again in 4 to 8 weeks

## 2018-08-05 ENCOUNTER — Encounter: Payer: Self-pay | Admitting: Family Medicine

## 2018-08-05 ENCOUNTER — Ambulatory Visit (INDEPENDENT_AMBULATORY_CARE_PROVIDER_SITE_OTHER): Payer: BLUE CROSS/BLUE SHIELD | Admitting: Family Medicine

## 2018-08-05 VITALS — BP 100/70 | HR 88 | Ht 72.0 in | Wt 158.0 lb

## 2018-08-05 DIAGNOSIS — M999 Biomechanical lesion, unspecified: Secondary | ICD-10-CM | POA: Diagnosis not present

## 2018-08-05 DIAGNOSIS — R293 Abnormal posture: Secondary | ICD-10-CM | POA: Diagnosis not present

## 2018-08-05 NOTE — Assessment & Plan Note (Signed)
Poor posture.  Discussed ergonomics.  Discussed which activities to do which wants to avoid.  Discussed home exercises.  We discussed avoiding any other type of triggers as well.  We discussed proper lifting mechanics with home exercises.  Follow-up again in 6 weeks

## 2018-08-05 NOTE — Assessment & Plan Note (Signed)
Decision today to treat with OMT was based on Physical Exam  After verbal consent patient was treated with HVLA, ME, FPR techniques in cervical, thoracic, rib, lumbar and sacral areas  Patient tolerated the procedure well with improvement in symptoms  Patient given exercises, stretches and lifestyle modifications  See medications in patient instructions if given  Patient will follow up in 4-6 weeks 

## 2018-08-05 NOTE — Patient Instructions (Signed)
Good to see you  Marvin [name] is your friend.  pennsaid pinkie amount topically 2 times daily as needed.  For the knee try biking a little more as well  Also straighten the leg when sitting and go up and down.  See me again in 6 weeks

## 2018-08-05 NOTE — Progress Notes (Signed)
[name] [place], [place] [address] Phone: [phone] Subjective:     I Marvin [name] am serving as a Education administrator for Dr. Hulan Saas.  CC: Back and neck pain follow-up  [id number]  Marvin [name] is a 42 y.o. male coming in with complaint of back pain. Left sided back pain. States everything feels the same. Patient continues to have discomfort overall.  Patient says more tightness on the left side of the neck.  A little different than previously.  Rates the severity pain 5 out of 10     Past Medical History:  Diagnosis Date  . Other and unspecified hyperlipidemia 01/18/2013   Past Surgical History:  Procedure Laterality Date  . APPENDECTOMY  2001   Social History   Socioeconomic History  . Marital status: Married    Spouse name: Not on file  . Number of children: Not on file  . Years of education: masters  . Highest education level: Not on file  Occupational History  . Occupation: Business  Social Needs  . Financial resource strain: Not on file  . Food insecurity:    Worry: Not on file    Inability: Not on file  . Transportation needs:    Medical: Not on file    Non-medical: Not on file  Tobacco Use  . Smoking status: Never Smoker  . Smokeless tobacco: Never Used  Substance and Sexual Activity  . Alcohol use: Yes    Comment: occasional social  . Drug use: No  . Sexual activity: Not on file  Lifestyle  . Physical activity:    Days per week: Not on file    Minutes per session: Not on file  . Stress: Not on file  Relationships  . Social connections:    Talks on phone: Not on file    Gets together: Not on file    Attends religious service: Not on file    Active member of club or organization: Not on file    Attends meetings of clubs or organizations: Not on file    Relationship status: Not on file  Other Topics Concern  . Not on file  Social History Narrative  . Not on file   No Known Allergies Family  History  Problem Relation Age of Onset  . Hyperlipidemia Other   . Hypertension Other   . Heart disease Other   . Diabetes Other   . Sudden death Other   . Prostate cancer Father      Current Outpatient Medications (Cardiovascular):  .  nitroGLYCERIN (NITRODUR - DOSED IN MG/24 HR) 0.2 mg/hr patch, 1/4 patch daily     Current Outpatient Medications (Other):  Marland Kitchen  Vitamin D, Ergocalciferol, (DRISDOL) 50000 units CAPS capsule, Take 1 capsule (50,000 Units total) by mouth every 7 (seven) days.    Past medical history, social, surgical and family history all reviewed in electronic medical record.  No pertanent information unless stated regarding to the chief complaint.   Review of Systems:  No headache, visual changes, nausea, vomiting, diarrhea, constipation, dizziness, abdominal pain, skin rash, fevers, chills, night sweats, weight loss, swollen lymph nodes, body aches, joint swelling,  chest pain, shortness of breath, mood changes.  Positive muscle aches  Objective  Blood pressure 100/70, pulse 88, height 6' (1.829 m), weight 158 lb (71.7 kg), SpO2 97 %.    General: No apparent distress alert and oriented x3 mood and affect normal, dressed appropriately.  HEENT: Pupils equal, extraocular movements intact  Respiratory: Patient's speak in full sentences and does not appear short of breath  Cardiovascular: No lower extremity edema, non tender, no erythema  Skin: Warm dry intact with no signs of infection or rash on extremities or on axial skeleton.  Abdomen: Soft nontender  Neuro: Cranial nerves II through XII are intact, neurovascularly intact in all extremities with 2+ DTRs and 2+ pulses.  Lymph: No lymphadenopathy of posterior or anterior cervical chain or axillae bilaterally.  Gait normal with good balance and coordination.  MSK:  Non tender with full range of motion and good stability and symmetric strength and tone of shoulders, elbows, wrist, hip, knee and ankles bilaterally.    Neck: Inspection lordosis.  Mildly decreased No palpable stepoffs. Negative Spurling's maneuver. Lacking last 5 degrees of extension and sidebending bilaterally Grip strength and sensation normal in bilateral hands Strength good C4 to T1 distribution No sensory change to C4 to T1 Negative Hoffman sign bilaterally Reflexes normal Tightness of the left trapezius  Back Exam:  Inspection: Loss of lordosis Motion: Flexion 45 deg, Extension 25 deg, Side Bending to 35 deg bilaterally,  Rotation to 45 deg bilaterally  SLR laying: Negative  XSLR laying: Negative  Palpable tenderness: Tender to palpation the paraspinal musculature lumbar spine more on the left sacroiliac joint. FABER: negative. Sensory change: Gross sensation intact to all lumbar and sacral dermatomes.  Reflexes: 2+ at both patellar tendons, 2+ at achilles tendons, Babinski's downgoing.  Strength at foot  Plantar-flexion: 5/5 Dorsi-flexion: 5/5 Eversion: 5/5 Inversion: 5/5  Leg strength  Quad: 5/5 Hamstring: 5/5 Hip flexor: 5/5 Hip abductors: 4/5 but symmetric Gait unremarkable.  Osteopathic findings C4 flexed rotated and side bent left C6 flexed rotated and side bent left T3 extended rotated and side bent left inhaled third rib T7 extended rotated and side bent right L2 flexed rotated and side bent right Sacrum right on right     Impression and Recommendations:     This case required medical decision making of moderate complexity. The above documentation has been reviewed and is accurate and complete Marvin Pulley, DO       Note: This dictation was prepared with Dragon dictation along with smaller phrase technology. Any transcriptional errors that result from this process are unintentional.

## 2018-08-24 DIAGNOSIS — Z23 Encounter for immunization: Secondary | ICD-10-CM | POA: Diagnosis not present

## 2018-09-15 NOTE — Progress Notes (Signed)
[name] [place], [place] [address] Phone: [phone] Subjective:   Marvin [name], am serving as a scribe for Dr. Hulan Saas.  CC: Heel pain back pain  [id number]  Marvin [name] is a 42 y.o. male coming in with complaint of back pain. He has had an increase in his pain over the past 3 days in his thoracic spine. He also has been having left heel pain for 3 weeks. Pain in the mornings and with his runs. Has decreased his mileage and this has helped somewhat. Has used ice and did stretch to alleviate his pain.     Past Medical History:  Diagnosis Date  . Other and unspecified hyperlipidemia 01/18/2013   Past Surgical History:  Procedure Laterality Date  . APPENDECTOMY  2001   Social History   Socioeconomic History  . Marital status: Married    Spouse name: Not on file  . Number of children: Not on file  . Years of education: masters  . Highest education level: Not on file  Occupational History  . Occupation: Business  Social Needs  . Financial resource strain: Not on file  . Food insecurity:    Worry: Not on file    Inability: Not on file  . Transportation needs:    Medical: Not on file    Non-medical: Not on file  Tobacco Use  . Smoking status: Never Smoker  . Smokeless tobacco: Never Used  Substance and Sexual Activity  . Alcohol use: Yes    Comment: occasional social  . Drug use: [name]  . Sexual activity: Not on file  Lifestyle  . Physical activity:    Days per week: Not on file    Minutes per session: Not on file  . Stress: Not on file  Relationships  . Social connections:    Talks on phone: Not on file    Gets together: Not on file    Attends religious service: Not on file    Active member of club or organization: Not on file    Attends meetings of clubs or organizations: Not on file    Relationship status: Not on file  Other Topics Concern  . Not on file  Social History Narrative  . Not on file    [name] Known Allergies Family History  Problem Relation Age of Onset  . Hyperlipidemia Other   . Hypertension Other   . Heart disease Other   . Diabetes Other   . Sudden death Other   . Prostate cancer Father      Current Outpatient Medications (Cardiovascular):  .  nitroGLYCERIN (NITRODUR - DOSED IN MG/24 HR) 0.2 mg/hr patch, 1/4 patch daily     Current Outpatient Medications (Other):  Marland Kitchen  Vitamin D, Ergocalciferol, (DRISDOL) 50000 units CAPS capsule, Take 1 capsule (50,000 Units total) by mouth every 7 (seven) days.    Past medical history, social, surgical and family history all reviewed in electronic medical record.  [name] pertanent information unless stated regarding to the chief complaint.   Review of Systems:  [name] headache, visual changes, nausea, vomiting, diarrhea, constipation, dizziness, abdominal pain, skin rash, fevers, chills, night sweats, weight loss, swollen lymph nodes, body aches, joint swelling, chest pain, shortness of breath, mood changes.  Positive muscle aches  Objective  Blood pressure 120/74, pulse 76, height 6' (1.829 m), weight 155 lb (70.3 kg), SpO2 98 %.    General: [name] apparent distress alert and oriented x3 mood and affect normal,  dressed appropriately.  HEENT: Pupils equal, extraocular movements intact  Respiratory: Patient's speak in full sentences and does not appear short of breath  Cardiovascular: [name] lower extremity edema, non tender, [name] erythema  Skin: Warm dry intact with [name] signs of infection or rash on extremities or on axial skeleton.  Abdomen: Soft nontender  Neuro: Cranial nerves II through XII are intact, neurovascularly intact in all extremities with 2+ DTRs and 2+ pulses.  Lymph: [name] lymphadenopathy of posterior or anterior cervical chain or axillae bilaterally.  Gait antalgic MSK:  tender with full range of motion and good stability and symmetric strength and tone of shoulders, elbows, wrist, hip, knee and bilaterally.    Heel exam  shows the patient does have some very mild tenderness to palpation over the medial aspect.  Patient does have full range of motion of the ankle.  Patient does have a mild narrow foot.  Very mild tightness of the posterior cord.  Osteopathic findings C2 flexed rotated and side bent right C4 flexed rotated and side bent left C6 flexed rotated and side bent left T3 extended rotated and side bent right inhaled third rib T9 extended rotated and side bent left L2 flexed rotated and side bent right Sacrum right on right   Limited musculoskeletal ultrasound was performed and interpreted by Lyndal Pulley Limited musculoskeletal ultrasound of patient's heel shows that patient spent letter fasciitis is within normal limits with [name] significant inflammation.  Patient does have a varicose pain on the medial aspect of the ankle causing some mild compression of the plantar nerve. Impression: Plantar nerve irritation    Impression and Recommendations:     This case required medical decision making of moderate complexity. The above documentation has been reviewed and is accurate and complete Lyndal Pulley, DO       Note: This dictation was prepared with Dragon dictation along with smaller phrase technology. Any transcriptional errors that result from this process are unintentional.

## 2018-09-16 ENCOUNTER — Ambulatory Visit (INDEPENDENT_AMBULATORY_CARE_PROVIDER_SITE_OTHER): Payer: BLUE CROSS/BLUE SHIELD | Admitting: Family Medicine

## 2018-09-16 ENCOUNTER — Ambulatory Visit: Payer: Self-pay

## 2018-09-16 ENCOUNTER — Encounter: Payer: Self-pay | Admitting: Family Medicine

## 2018-09-16 VITALS — BP 120/74 | HR 76 | Ht 72.0 in | Wt 155.0 lb

## 2018-09-16 DIAGNOSIS — M999 Biomechanical lesion, unspecified: Secondary | ICD-10-CM

## 2018-09-16 DIAGNOSIS — G5762 Lesion of plantar nerve, left lower limb: Secondary | ICD-10-CM | POA: Diagnosis not present

## 2018-09-16 DIAGNOSIS — R293 Abnormal posture: Secondary | ICD-10-CM | POA: Diagnosis not present

## 2018-09-16 DIAGNOSIS — M79672 Pain in left foot: Secondary | ICD-10-CM

## 2018-09-16 NOTE — Assessment & Plan Note (Signed)
Decision today to treat with OMT was based on Physical Exam  After verbal consent patient was treated with HVLA, ME, FPR techniques in cervical, thoracic, rib,  lumbar and sacral areas  Patient tolerated the procedure well with improvement in symptoms  Patient given exercises, stretches and lifestyle modifications  See medications in patient instructions if given  Patient will follow up in 4-8 weeks 

## 2018-09-16 NOTE — Patient Instructions (Addendum)
Good to see you  Ice when you need it.  Stay active Posture is key  Plantar nerve injury.  Not much to do  [email or web address] or another heel lift  Compression socks will help See me again in 6-7 weeks

## 2018-09-16 NOTE — Assessment & Plan Note (Signed)
Encourage patient to continue to work on posture.  Discussed ergonomics at work.  We discussed the importance of the scapular stability.  Discussed range of motion exercises of the neck.  Follow-up again in 4 to 8 weeks

## 2018-09-16 NOTE — Assessment & Plan Note (Signed)
I believe that this is likely more secondary to compression secondary to the varicose vein.  Discussed compression socks, heel lift, proper shoes, patient has been using running shoes that do have a heel drop that I think could be contributing to the strain in the area.  Follow-up with me again in 4 to 6 weeks

## 2018-10-27 NOTE — Progress Notes (Signed)
[name] [address], [place] [address] Phone: [phone] Subjective:   Marvin [name], am serving as a scribe for Dr. Hulan [name].  I'm seeing this patient by the request  of:    CC: Shoulder neck pain  [id number]  Marvin [name] is a 42 y.o. male coming in with complaint of right shoulder and cervical spine pain. Is here for OMT today to help manage his neck pain.       Past Medical History:  Diagnosis Date  . Other and unspecified hyperlipidemia 01/18/2013   Past Surgical History:  Procedure Laterality Date  . APPENDECTOMY  2001   Social History   Socioeconomic History  . Marital status: Married    Spouse name: Not on file  . Number of children: Not on file  . Years of education: masters  . Highest education level: Not on file  Occupational History  . Occupation: Business  Social Needs  . Financial resource strain: Not on file  . Food insecurity:    Worry: Not on file    Inability: Not on file  . Transportation needs:    Medical: Not on file    Non-medical: Not on file  Tobacco Use  . Smoking status: Never Smoker  . Smokeless tobacco: Never Used  Substance and Sexual Activity  . Alcohol use: Yes    Comment: occasional social  . Drug use: [name]  . Sexual activity: Not on file  Lifestyle  . Physical activity:    Days per week: Not on file    Minutes per session: Not on file  . Stress: Not on file  Relationships  . Social connections:    Talks on phone: Not on file    Gets together: Not on file    Attends religious service: Not on file    Active member of club or organization: Not on file    Attends meetings of clubs or organizations: Not on file    Relationship status: Not on file  Other Topics Concern  . Not on file  Social History Narrative  . Not on file   [name] Known Allergies Family History  Problem Relation Age of Onset  . Hyperlipidemia Other   . Hypertension Other   . Heart disease Other   .  Diabetes Other   . Sudden death Other   . Prostate cancer Father      Current Outpatient Medications (Cardiovascular):  .  nitroGLYCERIN (NITRODUR - DOSED IN MG/24 HR) 0.2 mg/hr patch, 1/4 patch daily     Current Outpatient Medications (Other):  Marland Kitchen  Vitamin D, Ergocalciferol, (DRISDOL) 50000 units CAPS capsule, Take 1 capsule (50,000 Units total) by mouth every 7 (seven) days.    Past medical history, social, surgical and family history all reviewed in electronic medical record.  [name] pertanent information unless stated regarding to the chief complaint.   Review of Systems:  [name] headache, visual changes, nausea, vomiting, diarrhea, constipation, dizziness, abdominal pain, skin rash, fevers, chills, night sweats, weight loss, swollen lymph nodes, body aches, joint swelling, chest pain, shortness of breath, mood changes.  Positive muscle aches  Objective  Blood pressure 120/72, pulse 63, height 6' (1.829 m), weight 160 lb (72.6 kg), SpO2 99 %.    General: [name] apparent distress alert and oriented x3 mood and affect normal, dressed appropriately.  HEENT: Pupils equal, extraocular movements intact  Respiratory: Patient's speak in full sentences and does not appear short of breath  Cardiovascular: [name] lower  extremity edema, non tender, [name] erythema  Skin: Warm dry intact with [name] signs of infection or rash on extremities or on axial skeleton.  Abdomen: Soft nontender  Neuro: Cranial nerves II through XII are intact, neurovascularly intact in all extremities with 2+ DTRs and 2+ pulses.  Lymph: [name] lymphadenopathy of posterior or anterior cervical chain or axillae bilaterally.  Gait normal with good balance and coordination.  MSK:  Non tender with full range of motion and good stability and symmetric strength and tone of shoulders, elbows, wrist, hip, knee and ankles bilaterally.  Neck: Inspection loss of lordosis. [name] palpable stepoffs. Negative Spurling's maneuver. Patient does have some  limited range of motion sidebending and rotation of the neck Grip strength and sensation normal in bilateral hands Strength good C4 to T1 distribution [name] sensory change to C4 to T1 Negative Hoffman sign bilaterally Reflexes normal Tightness of the trapezius.  Osteopathic findings  C2 flexed rotated and side bent right C4 flexed rotated and side bent left C7 flexed rotated and side bent left T3 extended rotated and side bent right inhaled third rib T5 extended rotated and side bent left L2 flexed rotated and side bent right Sacrum right on right     Impression and Recommendations:     This case required medical decision making of moderate complexity. The above documentation has been reviewed and is accurate and complete Marvin Pulley, DO       Note: This dictation was prepared with Dragon dictation along with smaller phrase technology. Any transcriptional errors that result from this process are unintentional.

## 2018-10-28 ENCOUNTER — Encounter: Payer: Self-pay | Admitting: Family Medicine

## 2018-10-28 ENCOUNTER — Ambulatory Visit (INDEPENDENT_AMBULATORY_CARE_PROVIDER_SITE_OTHER): Payer: BLUE CROSS/BLUE SHIELD | Admitting: Family Medicine

## 2018-10-28 VITALS — BP 120/72 | HR 63 | Ht 72.0 in | Wt 160.0 lb

## 2018-10-28 DIAGNOSIS — M999 Biomechanical lesion, unspecified: Secondary | ICD-10-CM

## 2018-10-28 DIAGNOSIS — M9901 Segmental and somatic dysfunction of cervical region: Secondary | ICD-10-CM

## 2018-10-28 DIAGNOSIS — R293 Abnormal posture: Secondary | ICD-10-CM

## 2018-10-28 DIAGNOSIS — M9908 Segmental and somatic dysfunction of rib cage: Secondary | ICD-10-CM | POA: Diagnosis not present

## 2018-10-28 DIAGNOSIS — M9903 Segmental and somatic dysfunction of lumbar region: Secondary | ICD-10-CM | POA: Diagnosis not present

## 2018-10-28 DIAGNOSIS — M9904 Segmental and somatic dysfunction of sacral region: Secondary | ICD-10-CM

## 2018-10-28 DIAGNOSIS — M9902 Segmental and somatic dysfunction of thoracic region: Secondary | ICD-10-CM

## 2018-10-28 NOTE — Assessment & Plan Note (Signed)
Doing significantly better at this time.  We discussed posture and ergonomics.  Responding well to osteopathic manipulation.  Patient still having some mild clavicle pain but nothing severe.  Patient will start increasing activity as tolerated.  Follow-up with me again 6 to 8 weeks

## 2018-10-28 NOTE — Assessment & Plan Note (Signed)
Decision today to treat with OMT was based on Physical Exam  After verbal consent patient was treated with HVLA, ME, FPR techniques in cervical, thoracic, rib lumbar and sacral areas  Patient tolerated the procedure well with improvement in symptoms  Patient given exercises, stretches and lifestyle modifications  See medications in patient instructions if given  Patient will follow up in 6-8 weeks 

## 2018-10-28 NOTE — Patient Instructions (Addendum)
Good to see you  Happy holidays! Push the shoulder a little by little See me again in 6-8 weeks

## 2018-12-09 ENCOUNTER — Ambulatory Visit: Payer: BLUE CROSS/BLUE SHIELD | Admitting: Family Medicine

## 2018-12-18 NOTE — Progress Notes (Signed)
[name] [address] [place], [place] [address] Phone: [phone] Subjective:   Fontaine No, am serving as a scribe for Dr. Hulan Saas.   CC: Back and neck pain follow-up  TOI:[id number]  Traeton [name] is a 43 y.o. male coming in with complaint of thoracic and lumbar spine pain. He felt a rib go out of place a couple weeks ago. Pain is improving though over past week. Is here today for OMT.      Past Medical History:  Diagnosis Date  . Other and unspecified hyperlipidemia 01/18/2013   Past Surgical History:  Procedure Laterality Date  . APPENDECTOMY  2001   Social History   Socioeconomic History  . Marital status: Married    Spouse name: Not on file  . Number of children: Not on file  . Years of education: masters  . Highest education level: Not on file  Occupational History  . Occupation: Business  Social Needs  . Financial resource strain: Not on file  . Food insecurity:    Worry: Not on file    Inability: Not on file  . Transportation needs:    Medical: Not on file    Non-medical: Not on file  Tobacco Use  . Smoking status: Never Smoker  . Smokeless tobacco: Never Used  Substance and Sexual Activity  . Alcohol use: Yes    Comment: occasional social  . Drug use: No  . Sexual activity: Not on file  Lifestyle  . Physical activity:    Days per week: Not on file    Minutes per session: Not on file  . Stress: Not on file  Relationships  . Social connections:    Talks on phone: Not on file    Gets together: Not on file    Attends religious service: Not on file    Active member of club or organization: Not on file    Attends meetings of clubs or organizations: Not on file    Relationship status: Not on file  Other Topics Concern  . Not on file  Social History Narrative  . Not on file   No Known Allergies Family History  Problem Relation Age of Onset  . Hyperlipidemia Other   . Hypertension Other   . Heart  disease Other   . Diabetes Other   . Sudden death Other   . Prostate cancer Father      Current Outpatient Medications (Cardiovascular):  .  nitroGLYCERIN (NITRODUR - DOSED IN MG/24 HR) 0.2 mg/hr patch, 1/4 patch daily     Current Outpatient Medications (Other):  Marland Kitchen  Vitamin D, Ergocalciferol, (DRISDOL) 50000 units CAPS capsule, Take 1 capsule (50,000 Units total) by mouth every 7 (seven) days.    Past medical history, social, surgical and family history all reviewed in electronic medical record.  No pertanent information unless stated regarding to the chief complaint.   Review of Systems:  No headache, visual changes, nausea, vomiting, diarrhea, constipation, dizziness, abdominal pain, skin rash, fevers, chills, night sweats, weight loss, swollen lymph nodes, body aches, joint swelling, , chest pain, shortness of breath, mood changes.  Positive muscle aches  Objective  Blood pressure 118/80, pulse 78, height 6' (1.829 m), weight 158 lb (71.7 kg), SpO2 98 %.   General: No apparent distress alert and oriented x3 mood and affect normal, dressed appropriately.  HEENT: Pupils equal, extraocular movements intact  Respiratory: Patient's speak in full sentences and does not appear short of breath  Cardiovascular:  No lower extremity edema, non tender, no erythema  Skin: Warm dry intact with no signs of infection or rash on extremities or on axial skeleton.  Abdomen: Soft nontender  Neuro: Cranial nerves II through XII are intact, neurovascularly intact in all extremities with 2+ DTRs and 2+ pulses.  Lymph: No lymphadenopathy of posterior or anterior cervical chain or axillae bilaterally.  Gait normal with good balance and coordination.  MSK:  Non tender with full range of motion and good stability and symmetric strength and tone of shoulders, elbows, wrist, hip, knee and ankles bilaterally.   Neck: Inspection mild loss of lordosis. No palpable stepoffs. Negative Spurling's  maneuver. Limited range of motion in 5 degrees and sidebending. Grip strength and sensation normal in bilateral hands Strength good C4 to T1 distribution No sensory change to C4 to T1 Negative Hoffman sign bilaterally Reflexes normal Tightness of the trapezius bilaterally Moderate tightness in the periscapular region.  Back Exam:  Inspection: Unremarkable  Motion: Flexion 45 deg, Extension 45 deg, Side Bending to 45 deg bilaterally,  Rotation to 45 deg bilaterally  SLR laying: Negative  XSLR laying: Negative  Palpable tenderness: Tender to palpation of paraspinal musculature. FABER: negative. Sensory change: Gross sensation intact to all lumbar and sacral dermatomes.  Reflexes: 2+ at both patellar tendons, 2+ at achilles tendons, Babinski's downgoing.  Strength at foot  Plantar-flexion: 5/5 Dorsi-flexion: 5/5 Eversion: 5/5 Inversion: 5/5  Leg strength  Quad: 5/5 Hamstring: 5/5 Hip flexor: 5/5 Hip abductors: 4/5 but symmetric Gait unremarkable.   Osteopathic findings C2 flexed rotated and side bent right C7 flexed rotated and side bent left T3 extended rotated and side bent right inhaled third rib T6 extended rotated and side bent left L3 flexed rotated and side bent right Sacrum right on right    Impression and Recommendations:     This case required medical decision making of moderate complexity. The above documentation has been reviewed and is accurate and complete Lyndal Pulley, DO       Note: This dictation was prepared with Dragon dictation along with smaller phrase technology. Any transcriptional errors that result from this process are unintentional.

## 2018-12-21 ENCOUNTER — Ambulatory Visit (INDEPENDENT_AMBULATORY_CARE_PROVIDER_SITE_OTHER): Payer: Self-pay | Admitting: Family Medicine

## 2018-12-21 ENCOUNTER — Encounter: Payer: Self-pay | Admitting: Family Medicine

## 2018-12-21 VITALS — BP 118/80 | HR 78 | Ht 72.0 in | Wt 158.0 lb

## 2018-12-21 DIAGNOSIS — R293 Abnormal posture: Secondary | ICD-10-CM

## 2018-12-21 DIAGNOSIS — M999 Biomechanical lesion, unspecified: Secondary | ICD-10-CM

## 2018-12-21 NOTE — Assessment & Plan Note (Signed)
Poor posture.  Discussed icing regimen and home exercise.  Discussed which activities to do which wants to avoid.  Discussed home exercises, which activities to do which wants to avoid.  Follow-up again in 4 to 8 weeks.

## 2018-12-21 NOTE — Assessment & Plan Note (Signed)
Decision today to treat with OMT was based on Physical Exam  After verbal consent patient was treated with HVLA, ME, FPR techniques in cervical, thoracic, rib, lumbar and sacral areas  Patient tolerated the procedure well with improvement in symptoms  Patient given exercises, stretches and lifestyle modifications  See medications in patient instructions if given  Patient will follow up in 4 weeks 

## 2018-12-21 NOTE — Patient Instructions (Signed)
Good to see you  Ice is your friend Stay active I am here if you need me See me again in 3 weeks just in case

## 2019-01-11 NOTE — Progress Notes (Signed)
[name] [address] [place], [place] [address] Phone: [phone] Subjective:   Fontaine No, am serving as a scribe for Dr. Hulan Saas.  I'm seeing this patient by the request  of:    CC:   [id number]  Marvin [name] is a 43 y.o. male coming in with complaint of back pain. Having some tightness in the right posterior shoulder which he feels is due to his clavicle injury.     Past Medical History:  Diagnosis Date  . Other and unspecified hyperlipidemia 01/18/2013   Past Surgical History:  Procedure Laterality Date  . APPENDECTOMY  2001   Social History   Socioeconomic History  . Marital status: Married    Spouse name: Not on file  . Number of children: Not on file  . Years of education: masters  . Highest education level: Not on file  Occupational History  . Occupation: Business  Social Needs  . Financial resource strain: Not on file  . Food insecurity:    Worry: Not on file    Inability: Not on file  . Transportation needs:    Medical: Not on file    Non-medical: Not on file  Tobacco Use  . Smoking status: Never Smoker  . Smokeless tobacco: Never Used  Substance and Sexual Activity  . Alcohol use: Yes    Comment: occasional social  . Drug use: No  . Sexual activity: Not on file  Lifestyle  . Physical activity:    Days per week: Not on file    Minutes per session: Not on file  . Stress: Not on file  Relationships  . Social connections:    Talks on phone: Not on file    Gets together: Not on file    Attends religious service: Not on file    Active member of club or organization: Not on file    Attends meetings of clubs or organizations: Not on file    Relationship status: Not on file  Other Topics Concern  . Not on file  Social History Narrative  . Not on file   No Known Allergies Family History  Problem Relation Age of Onset  . Hyperlipidemia Other   . Hypertension Other   . Heart disease Other   .  Diabetes Other   . Sudden death Other   . Prostate cancer Father      Current Outpatient Medications (Cardiovascular):  .  nitroGLYCERIN (NITRODUR - DOSED IN MG/24 HR) 0.2 mg/hr patch, 1/4 patch daily     Current Outpatient Medications (Other):  Marland Kitchen  Vitamin D, Ergocalciferol, (DRISDOL) 50000 units CAPS capsule, Take 1 capsule (50,000 Units total) by mouth every 7 (seven) days.    Past medical history, social, surgical and family history all reviewed in electronic medical record.  No pertanent information unless stated regarding to the chief complaint.   Review of Systems:  No headache, visual changes, nausea, vomiting, diarrhea, constipation, dizziness, abdominal pain, skin rash, fevers, chills, night sweats, weight loss, swollen lymph nodes, body aches, joint swelling, muscle aches, chest pain, shortness of breath, mood changes.   Objective  There were no vitals taken for this visit. Systems examined below as of    General: No apparent distress alert and oriented x3 mood and affect normal, dressed appropriately.  HEENT: Pupils equal, extraocular movements intact  Respiratory: Patient's speak in full sentences and does not appear short of breath  Cardiovascular: No lower extremity edema, non tender, no erythema  Skin: Warm dry intact with no signs of infection or rash on extremities or on axial skeleton.  Abdomen: Soft nontender  Neuro: Cranial nerves II through XII are intact, neurovascularly intact in all extremities with 2+ DTRs and 2+ pulses.  Lymph: No lymphadenopathy of posterior or anterior cervical chain or axillae bilaterally.  Gait normal with good balance and coordination.  MSK:  Non tender with full range of motion and good stability and symmetric strength and tone of shoulders, elbows, wrist, hip, knee and ankles bilaterally.     Impression and Recommendations:     This case required medical decision making of moderate complexity. The above documentation has been  reviewed and is accurate and complete Lyndal Pulley, DO       Note: This dictation was prepared with Dragon dictation along with smaller phrase technology. Any transcriptional errors that result from this process are unintentional.

## 2019-01-12 ENCOUNTER — Encounter: Payer: Self-pay | Admitting: Family Medicine

## 2019-01-12 ENCOUNTER — Ambulatory Visit (INDEPENDENT_AMBULATORY_CARE_PROVIDER_SITE_OTHER): Payer: BLUE CROSS/BLUE SHIELD | Admitting: Family Medicine

## 2019-01-12 VITALS — BP 120/74 | HR 95 | Ht 72.0 in | Wt 160.0 lb

## 2019-01-12 DIAGNOSIS — M999 Biomechanical lesion, unspecified: Secondary | ICD-10-CM | POA: Diagnosis not present

## 2019-01-12 DIAGNOSIS — R293 Abnormal posture: Secondary | ICD-10-CM

## 2019-01-12 DIAGNOSIS — S4991XA Unspecified injury of right shoulder and upper arm, initial encounter: Secondary | ICD-10-CM | POA: Diagnosis not present

## 2019-01-12 MED ORDER — VITAMIN D (ERGOCALCIFEROL) 1.25 MG (50000 UNIT) PO CAPS: 50000.0000 [IU] | ORAL_CAPSULE | ORAL | 0 refills | Status: DC

## 2019-01-12 NOTE — Assessment & Plan Note (Signed)
Patient continues to work on posture.  We discussed home exercise, icing regimen, which activities to do which was to avoid.  Discussed this could take some time.  Patient will need to continue to work on this lifelong.  Follow-up again in 5 weeks

## 2019-01-12 NOTE — Assessment & Plan Note (Signed)
Has had injury to this previously.  Having worsening symptoms again.  Started once weekly vitamin D that seemed to help last time.  Discussed icing regimen.  Discussed keeping hands within peripheral vision.  Follow-up again in 5 weeks

## 2019-01-12 NOTE — Patient Instructions (Signed)
Good to see you  Ice is your friend Once weekly vitamin D again  Stay active and find a little time working out  See me again in 5 weeks

## 2019-01-12 NOTE — Assessment & Plan Note (Signed)
Decision today to treat with OMT was based on Physical Exam  After verbal consent patient was treated with HVLA, ME, FPR techniques in cervical, thoracic, rib, lumbar and sacral areas  Patient tolerated the procedure well with improvement in symptoms  Patient given exercises, stretches and lifestyle modifications  See medications in patient instructions if given  Patient will follow up in 4-6 weeks 

## 2019-02-17 ENCOUNTER — Ambulatory Visit: Payer: BLUE CROSS/BLUE SHIELD | Admitting: Family Medicine

## 2019-02-23 ENCOUNTER — Encounter: Payer: BLUE CROSS/BLUE SHIELD | Admitting: Internal Medicine

## 2019-03-18 ENCOUNTER — Ambulatory Visit: Payer: BLUE CROSS/BLUE SHIELD | Admitting: Family Medicine

## 2019-04-08 ENCOUNTER — Ambulatory Visit (INDEPENDENT_AMBULATORY_CARE_PROVIDER_SITE_OTHER): Payer: BLUE CROSS/BLUE SHIELD | Admitting: Internal Medicine

## 2019-04-08 ENCOUNTER — Encounter: Payer: Self-pay | Admitting: Internal Medicine

## 2019-04-08 DIAGNOSIS — Z0000 Encounter for general adult medical examination without abnormal findings: Secondary | ICD-10-CM

## 2019-04-08 NOTE — Assessment & Plan Note (Signed)

## 2019-04-08 NOTE — Patient Instructions (Signed)

## 2019-04-08 NOTE — Progress Notes (Signed)
Patient ID: Marvin [name], male   DOB: 14-Sep-1976, 43 y.o.   MRN: [id number]  Virtual Visit via Video Note  I connected with Marvin [name] on 04/08/19 at  8:00 AM EDT by a video enabled telemedicine application and verified that I am speaking with the correct person using two identifiers.  Location: Patient: at home, no others present Provider: at office   I discussed the limitations of evaluation and management by telemedicine and the availability of in person appointments. The patient expressed understanding and agreed to proceed.  History of Present Illness: Here for wellness and f/u;  Overall doing ok;  Pt denies Chest pain, worsening SOB, DOE, wheezing, orthopnea, PND, worsening LE edema, palpitations, dizziness or syncope.  Pt denies neurological change such as new headache, facial or extremity weakness.  Pt denies polydipsia, polyuria, or low sugar symptoms. Pt states overall good compliance with treatment and medications, good tolerability, and has been trying to follow appropriate diet.  Pt denies worsening depressive symptoms, suicidal ideation or panic. No fever, night sweats, wt loss, loss of appetite, or other constitutional symptoms.  Pt states good ability with ADL's, has low fall risk, home safety reviewed and adequate, no other significant changes in hearing or vision, and occasionally active with exercise.  To see Dr Tamala Julian next month for persistent right shoulder pain.  No real wt change.  Runs for exercise about 30 min twice perwk, and longer run on the weekends.  Also mentions some difficulty with urination with slight slowing of the stream and start and stop and start again in mid stream. Denies urinary symptoms such as dysuria, frequency, urgency, flank pain, hematuria or n/v, fever, chills. Past Medical History:  Diagnosis Date  . Other and unspecified hyperlipidemia 01/18/2013   Past Surgical History:  Procedure Laterality Date  . APPENDECTOMY  2001    reports that he has  never smoked. He has never used smokeless tobacco. He reports current alcohol use. He reports that he does not use drugs. family history includes Diabetes in an other family member; Heart disease in an other family member; Hyperlipidemia in an other family member; Hypertension in an other family member; Prostate cancer in his father; Sudden death in an other family member. No Known Allergies No current outpatient medications on file prior to visit.   No current facility-administered medications on file prior to visit.    Observations/Objective: Alert, NAD, appropriate mood and affect, resps normal, cn 2-12 intact, moves all 4s, no visible rash or swelling Lab Results  Component Value Date   WBC 5.9 02/02/2018   HGB 16.1 02/02/2018   HCT 45.1 02/02/2018   PLT 184.0 02/02/2018   GLUCOSE 87 02/02/2018   CHOL 184 02/02/2018   TRIG 68.0 02/02/2018   HDL 54.80 02/02/2018   LDLDIRECT 114.1 01/18/2013   LDLCALC 115 (H) 02/02/2018   ALT 24 02/02/2018   AST 25 02/02/2018   NA 139 02/02/2018   K 4.2 02/02/2018   CL 103 02/02/2018   CREATININE 0.85 02/02/2018   BUN 14 02/02/2018   CO2 30 02/02/2018   TSH 0.93 02/02/2018   PSA 0.58 02/02/2018   Assessment and Plan: See notes  Follow Up Instructions: See notes   I discussed the assessment and treatment plan with the patient. The patient was provided an opportunity to ask questions and all were answered. The patient agreed with the plan and demonstrated an understanding of the instructions.   The patient was advised to call back or seek an in-person evaluation  if the symptoms worsen or if the condition fails to improve as anticipated.   Cathlean Cower, MD

## 2019-04-12 ENCOUNTER — Ambulatory Visit: Payer: BLUE CROSS/BLUE SHIELD | Admitting: Family Medicine

## 2019-04-14 ENCOUNTER — Encounter: Payer: Self-pay | Admitting: Family Medicine

## 2019-04-14 ENCOUNTER — Other Ambulatory Visit: Payer: Self-pay

## 2019-04-14 ENCOUNTER — Ambulatory Visit (INDEPENDENT_AMBULATORY_CARE_PROVIDER_SITE_OTHER): Payer: BLUE CROSS/BLUE SHIELD | Admitting: Family Medicine

## 2019-04-14 ENCOUNTER — Other Ambulatory Visit (INDEPENDENT_AMBULATORY_CARE_PROVIDER_SITE_OTHER): Payer: BLUE CROSS/BLUE SHIELD

## 2019-04-14 VITALS — BP 102/70 | HR 65 | Ht 72.0 in | Wt 156.0 lb

## 2019-04-14 DIAGNOSIS — S4991XA Unspecified injury of right shoulder and upper arm, initial encounter: Secondary | ICD-10-CM

## 2019-04-14 DIAGNOSIS — M999 Biomechanical lesion, unspecified: Secondary | ICD-10-CM | POA: Diagnosis not present

## 2019-04-14 DIAGNOSIS — Z0000 Encounter for general adult medical examination without abnormal findings: Secondary | ICD-10-CM | POA: Diagnosis not present

## 2019-04-14 LAB — URINALYSIS, ROUTINE W REFLEX MICROSCOPIC
Bilirubin Urine: NEGATIVE
Hgb urine dipstick: NEGATIVE
Ketones, ur: NEGATIVE
Leukocytes,Ua: NEGATIVE
Nitrite: NEGATIVE
RBC / HPF: NONE SEEN (ref 0–?)
Specific Gravity, Urine: 1.025 (ref 1.000–1.030)
Urine Glucose: NEGATIVE
Urobilinogen, UA: 0.2 (ref 0.0–1.0)
pH: 6.5 (ref 5.0–8.0)

## 2019-04-14 NOTE — Assessment & Plan Note (Signed)
Decision today to treat with OMT was based on Physical Exam  After verbal consent patient was treated with HVLA, ME, FPR techniques in cervical, thoracic, rib lumbar and sacral areas  Patient tolerated the procedure well with improvement in symptoms  Patient given exercises, stretches and lifestyle modifications  See medications in patient instructions if given  Patient will follow up in 4-6 weeks 

## 2019-04-14 NOTE — Assessment & Plan Note (Signed)
Patient has had an injury to the clavicle previously.  We discussed with patient in great length.  We discussed at this time with no significant findings on x-ray that we would need to consider the possibility of an MRI.  Patient will monitor weight on this at the moment.  Encouraged him to increase activity and see if it increases the pain.  If it does then will consider this.  Patient will follow-up with me again in 4 to 6 weeks for further evaluation and manipulation.

## 2019-04-14 NOTE — Progress Notes (Signed)
[name] [place], [place] [address] Phone: [phone] Subjective:   I Marvin [name] am serving as a Education administrator for Dr. Hulan Saas.  I'm seeing this patient by the request  of:    CC: Right shoulder pain and back pain follow-up  EMV:[id number]  Marvin [name] is a 43 y.o. male coming in with complaint of back pain. States that his lower back is bothering him. Right shoulder is painful. States that he feels it is not healed.  Patient states still pain with certain range of motion around the clavicle. Not as much on the shoulder seems to be all over the clavicle overall.  Patient thinks it is just does not seem to be improving overall.       Past Medical History:  Diagnosis Date  . Other and unspecified hyperlipidemia 01/18/2013   Past Surgical History:  Procedure Laterality Date  . APPENDECTOMY  2001   Social History   Socioeconomic History  . Marital status: Married    Spouse name: Not on file  . Number of children: Not on file  . Years of education: masters  . Highest education level: Not on file  Occupational History  . Occupation: Business  Social Needs  . Financial resource strain: Not on file  . Food insecurity:    Worry: Not on file    Inability: Not on file  . Transportation needs:    Medical: Not on file    Non-medical: Not on file  Tobacco Use  . Smoking status: Never Smoker  . Smokeless tobacco: Never Used  Substance and Sexual Activity  . Alcohol use: Yes    Comment: occasional social  . Drug use: No  . Sexual activity: Not on file  Lifestyle  . Physical activity:    Days per week: Not on file    Minutes per session: Not on file  . Stress: Not on file  Relationships  . Social connections:    Talks on phone: Not on file    Gets together: Not on file    Attends religious service: Not on file    Active member of club or organization: Not on file    Attends meetings of clubs or organizations: Not on file     Relationship status: Not on file  Other Topics Concern  . Not on file  Social History Narrative  . Not on file   No Known Allergies Family History  Problem Relation Age of Onset  . Hyperlipidemia Other   . Hypertension Other   . Heart disease Other   . Diabetes Other   . Sudden death Other   . Prostate cancer Father    No current outpatient medications on file.    Past medical history, social, surgical and family history all reviewed in electronic medical record.  No pertanent information unless stated regarding to the chief complaint.   Review of Systems:  No headache, visual changes, nausea, vomiting, diarrhea, constipation, dizziness, abdominal pain, skin rash, fevers, chills, night sweats, weight loss, swollen lymph nodes, body aches, joint swelling, muscle aches, chest pain, shortness of breath, mood changes.   Objective  Blood pressure 102/70, pulse 65, height 6' (1.829 m), weight 156 lb (70.8 kg), SpO2 98 %.    General: No apparent distress alert and oriented x3 mood and affect normal, dressed appropriately.  HEENT: Pupils equal, extraocular movements intact  Respiratory: Patient's speak in full sentences and does not appear short of breath  Cardiovascular: No  lower extremity edema, non tender, no erythema  Skin: Warm dry intact with no signs of infection or rash on extremities or on axial skeleton.  Abdomen: Soft nontender  Neuro: Cranial nerves II through XII are intact, neurovascularly intact in all extremities with 2+ DTRs and 2+ pulses.  Lymph: No lymphadenopathy of posterior or anterior cervical chain or axillae bilaterally.  Gait normal with good balance and coordination.  MSK:  Non tender with full range of motion and good stability and symmetric strength and tone of  elbows, wrist, hip, knee and ankles bilaterally.  Right shoulder exam does not show any impingement.  Tender to palpation in the mid clavicle and closer to the [facility] joint.  No pain over the  acromioclavicular joint.  Mild pain with resisted adduction of the arm.  Neurovascular intact distally. Pain in the medial border of the scapula noted.  Osteopathic findings  C2 flexed rotated and side bent right T3 extended rotated and side bent right inhaled third rib T9 extended rotated and side bent left L2 flexed rotated and side bent right Sacrum right on right      Impression and Recommendations:     This case required medical decision making of moderate complexity. The above documentation has been reviewed and is accurate and complete Marvin Pulley, DO       Note: This dictation was prepared with Dragon dictation along with smaller phrase technology. Any transcriptional errors that result from this process are unintentional.

## 2019-04-14 NOTE — Patient Instructions (Signed)
Good to see you  Stay active Go to town on the shoulder and see how it goes.  If pain worsens we will get mri  See me again in 4 weeks

## 2019-06-07 DIAGNOSIS — Z20828 Contact with and (suspected) exposure to other viral communicable diseases: Secondary | ICD-10-CM | POA: Diagnosis not present

## 2020-10-17 ENCOUNTER — Encounter: Payer: Self-pay | Admitting: Internal Medicine

## 2020-10-17 ENCOUNTER — Other Ambulatory Visit: Payer: Self-pay

## 2020-10-17 ENCOUNTER — Ambulatory Visit (INDEPENDENT_AMBULATORY_CARE_PROVIDER_SITE_OTHER): Payer: BC Managed Care – PPO | Admitting: Internal Medicine

## 2020-10-17 VITALS — BP 110/76 | HR 80 | Temp 99.3°F | Ht 72.0 in | Wt 158.0 lb

## 2020-10-17 DIAGNOSIS — Z23 Encounter for immunization: Secondary | ICD-10-CM | POA: Diagnosis not present

## 2020-10-17 DIAGNOSIS — E559 Vitamin D deficiency, unspecified: Secondary | ICD-10-CM | POA: Diagnosis not present

## 2020-10-17 DIAGNOSIS — Z0000 Encounter for general adult medical examination without abnormal findings: Secondary | ICD-10-CM

## 2020-10-17 DIAGNOSIS — E538 Deficiency of other specified B group vitamins: Secondary | ICD-10-CM | POA: Diagnosis not present

## 2020-10-17 NOTE — Progress Notes (Signed)
   Subjective:    Patient ID: Marvin [name], male    DOB: 04-10-1976, 44 y.o.   MRN: [id number]  HPI  Here for wellness and f/u;  Overall doing ok;  Pt denies Chest pain, worsening SOB, DOE, wheezing, orthopnea, PND, worsening LE edema, palpitations, dizziness or syncope.  Pt denies neurological change such as new headache, facial or extremity weakness.  Pt denies polydipsia, polyuria, or low sugar symptoms. Pt states overall good compliance with treatment and medications, good tolerability, and has been trying to follow appropriate diet.  Pt denies worsening depressive symptoms, suicidal ideation or panic. No fever, night sweats, wt loss, loss of appetite, or other constitutional symptoms.  Pt states good ability with ADL's, has low fall risk, home safety reviewed and adequate, no other significant changes in hearing or vision, and only occasionally active with exercise.  No new complaints Past Medical History:  Diagnosis Date  . Other and unspecified hyperlipidemia 01/18/2013   Past Surgical History:  Procedure Laterality Date  . APPENDECTOMY  2001    reports that he has never smoked. He has never used smokeless tobacco. He reports current alcohol use. He reports that he does not use drugs. family history includes Diabetes in an other family member; Heart disease in an other family member; Hyperlipidemia in an other family member; Hypertension in an other family member; Prostate cancer in his father; Sudden death in an other family member. No Known Allergies No current outpatient medications on file prior to visit.   No current facility-administered medications on file prior to visit.   Review of Systems All otherwise neg per pt    Objective:   Physical Exam BP 110/76 (BP Location: Left Arm, Patient Position: Sitting, Cuff Size: Large)   Pulse 80   Temp 99.3 F (37.4 C) (Oral)   Ht 6' (1.829 m)   Wt 158 lb (71.7 kg)   SpO2 97%   BMI 21.43 kg/m  VS noted,  Constitutional: Pt appears  in NAD HENT: Head: NCAT.  Right Ear: External ear normal.  Left Ear: External ear normal.  Eyes: . Pupils are equal, round, and reactive to light. Conjunctivae and EOM are normal Nose: without d/c or deformity Neck: Neck supple. Gross normal ROM Cardiovascular: Normal rate and regular rhythm.   Pulmonary/Chest: Effort normal and breath sounds without rales or wheezing.  Abd:  Soft, NT, ND, + BS, no organomegaly Neurological: Pt is alert. At baseline orientation, motor grossly intact Skin: Skin is warm. No rashes, other new lesions, no LE edema Psychiatric: Pt behavior is normal without agitation  Lab Results  Component Value Date   WBC 5.9 02/02/2018   HGB 16.1 02/02/2018   HCT 45.1 02/02/2018   PLT 184.0 02/02/2018   GLUCOSE 87 02/02/2018   CHOL 184 02/02/2018   TRIG 68.0 02/02/2018   HDL 54.80 02/02/2018   LDLDIRECT 114.1 01/18/2013   LDLCALC 115 (H) 02/02/2018   ALT 24 02/02/2018   AST 25 02/02/2018   NA 139 02/02/2018   K 4.2 02/02/2018   CL 103 02/02/2018   CREATININE 0.85 02/02/2018   BUN 14 02/02/2018   CO2 30 02/02/2018   TSH 0.93 02/02/2018   PSA 0.58 02/02/2018      Assessment & Plan:

## 2020-10-17 NOTE — Patient Instructions (Signed)
You had the Tdap tetanus shot today  Please continue all other medications as before, and refills have been done if requested.  Please have the pharmacy call with any other refills you may need.  Please continue your efforts at being more active, low cholesterol diet, and weight control.  You are otherwise up to date with prevention measures today.  Please keep your appointments with your specialists as you may have planned  Please go to the LAB at the blood drawing area for the tests to be done at the [facility] lab at your convenience  You will be contacted by phone if any changes need to be made immediately.  Otherwise, you will receive a letter about your results with an explanation, but please check with MyChart first.  Please remember to sign up for MyChart if you have not done so, as this will be important to you in the future with finding out test results, communicating by private email, and scheduling acute appointments online when needed.  Please make an Appointment to return for your 1 year visit, or sooner if needed

## 2020-10-21 ENCOUNTER — Encounter: Payer: Self-pay | Admitting: Internal Medicine

## 2020-10-21 NOTE — Assessment & Plan Note (Signed)

## 2020-10-30 ENCOUNTER — Other Ambulatory Visit (INDEPENDENT_AMBULATORY_CARE_PROVIDER_SITE_OTHER): Payer: BC Managed Care – PPO

## 2020-10-30 ENCOUNTER — Encounter: Payer: Self-pay | Admitting: Internal Medicine

## 2020-10-30 ENCOUNTER — Ambulatory Visit (INDEPENDENT_AMBULATORY_CARE_PROVIDER_SITE_OTHER): Payer: BC Managed Care – PPO | Admitting: Internal Medicine

## 2020-10-30 ENCOUNTER — Telehealth: Payer: Self-pay | Admitting: Internal Medicine

## 2020-10-30 ENCOUNTER — Other Ambulatory Visit: Payer: Self-pay

## 2020-10-30 DIAGNOSIS — R109 Unspecified abdominal pain: Secondary | ICD-10-CM | POA: Diagnosis not present

## 2020-10-30 DIAGNOSIS — R350 Frequency of micturition: Secondary | ICD-10-CM

## 2020-10-30 DIAGNOSIS — R310 Gross hematuria: Secondary | ICD-10-CM

## 2020-10-30 MED ORDER — HYDROCODONE-ACETAMINOPHEN 5-325 MG PO TABS
1.0000 | ORAL_TABLET | Freq: Four times a day (QID) | ORAL | 0 refills | Status: DC | PRN
Start: 2020-10-30 — End: 2021-10-19

## 2020-10-30 NOTE — Telephone Encounter (Signed)
Patient called [facility]   Patient has blood in urine and lower back pain now with chills.   [facility] advised to see PCP within 24 hours  Called and left a message to schedule patient.

## 2020-10-30 NOTE — Progress Notes (Signed)
   Subjective:    Patient ID: Marvin [name], male    DOB: 12/31/1975, 44 y.o.   MRN: [id number]  HPI  Here to f/u after onset of small volume gross hematuria after walking the dog yesterday am, then shortly thereafter has some urinary frequency.  Today seems overall improved but did also have moderate right flank pain, chills, sweats, but not sure if fever.  Denies n/v.  Today still with faint pain to the right flank area only  No hx of renal stones or family hx.    Denies other urinary symptoms such as dysuria, urgency.  Pt denies chest pain, increased sob or doe, wheezing, orthopnea, PND, increased LE swelling, palpitations, dizziness or syncope.   Pt denies polydipsia, polyuria,  Past Medical History:  Diagnosis Date  . Other and unspecified hyperlipidemia 01/18/2013   Past Surgical History:  Procedure Laterality Date  . APPENDECTOMY  2001    reports that he has never smoked. He has never used smokeless tobacco. He reports current alcohol use. He reports that he does not use drugs. family history includes Diabetes in an other family member; Heart disease in an other family member; Hyperlipidemia in an other family member; Hypertension in an other family member; Prostate cancer in his father; Sudden death in an other family member. No Known Allergies No current outpatient medications on file prior to visit.   No current facility-administered medications on file prior to visit.   Review of Systems All otherwise neg per pt    Objective:   Physical Exam BP 102/70 (BP Location: Left Arm, Patient Position: Sitting, Cuff Size: Large)   Pulse 71   Temp 98.7 F (37.1 C) (Oral)   Ht 6' (1.829 m)   Wt 153 lb (69.4 kg)   SpO2 97%   BMI 20.75 kg/m  VS noted,  Constitutional: Pt appears in NAD HENT: Head: NCAT.  Right Ear: External ear normal.  Left Ear: External ear normal.  Eyes: . Pupils are equal, round, and reactive to light. Conjunctivae and EOM are normal Nose: without d/c or  deformity Neck: Neck supple. Gross normal ROM Cardiovascular: Normal rate and regular rhythm.   Pulmonary/Chest: Effort normal and breath sounds without rales or wheezing.  Abd:  Soft, NT, ND, + BS, no organomegaly Neurological: Pt is alert. At baseline orientation, motor grossly intact Skin: Skin is warm. No rashes, other new lesions, no LE edema Psychiatric: Pt behavior is normal without agitation  All otherwise neg per pt Lab Results  Component Value Date   WBC 6.8 10/30/2020   HGB 17.4 (H) 10/30/2020   HCT 50.9 10/30/2020   PLT 222.0 10/30/2020   GLUCOSE 100 (H) 10/30/2020   CHOL 184 02/02/2018   TRIG 68.0 02/02/2018   HDL 54.80 02/02/2018   LDLDIRECT 114.1 01/18/2013   LDLCALC 115 (H) 02/02/2018   ALT 24 02/02/2018   AST 25 02/02/2018   NA 139 10/30/2020   K 4.2 10/30/2020   CL 101 10/30/2020   CREATININE 0.96 10/30/2020   BUN 13 10/30/2020   CO2 29 10/30/2020   TSH 0.93 02/02/2018   PSA 0.58 02/02/2018      Assessment & Plan:

## 2020-10-30 NOTE — Patient Instructions (Signed)
Please take all new medication as prescribed - the pain medication only for severe pain  Please continue all other medications as before, and refills have been done if requested.  Please have the pharmacy call with any other refills you may need.  Please keep your appointments with your specialists as you may have planned  You will be contacted regarding the referral for: CT scan hopefully for Nov 30, and Urology  Please go to the LAB at the blood drawing area for the tests to be done - at the [facility] lab before 530 pm today  You will be contacted by phone if any changes need to be made immediately.  Otherwise, you will receive a letter about your results with an explanation, but please check with MyChart first.  Please remember to sign up for MyChart if you have not done so, as this will be important to you in the future with finding out test results, communicating by private email, and scheduling acute appointments online when needed.

## 2020-10-31 ENCOUNTER — Encounter: Payer: Self-pay | Admitting: Internal Medicine

## 2020-10-31 ENCOUNTER — Ambulatory Visit
Admission: RE | Admit: 2020-10-31 | Discharge: 2020-10-31 | Disposition: A | Discharge status: Home / Self Care | Payer: BC Managed Care – PPO | Source: Ambulatory Visit | Attending: Internal Medicine | Admitting: Internal Medicine

## 2020-10-31 DIAGNOSIS — R109 Unspecified abdominal pain: Secondary | ICD-10-CM

## 2020-10-31 DIAGNOSIS — R188 Other ascites: Secondary | ICD-10-CM | POA: Diagnosis not present

## 2020-10-31 LAB — CBC WITH DIFFERENTIAL/PLATELET
Basophils Absolute: 0.1 10*3/uL (ref 0.0–0.1)
Basophils Relative: 0.8 % (ref 0.0–3.0)
Eosinophils Absolute: 0.1 10*3/uL (ref 0.0–0.7)
Eosinophils Relative: 0.9 % (ref 0.0–5.0)
HCT: 50.9 % (ref 39.0–52.0)
Hemoglobin: 17.4 g/dL — ABNORMAL HIGH (ref 13.0–17.0)
Lymphocytes Relative: 36.6 % (ref 12.0–46.0)
Lymphs Abs: 2.5 10*3/uL (ref 0.7–4.0)
MCHC: 34.1 g/dL (ref 30.0–36.0)
MCV: 91.2 fl (ref 78.0–100.0)
Monocytes Absolute: 0.7 10*3/uL (ref 0.1–1.0)
Monocytes Relative: 10.5 % (ref 3.0–12.0)
Neutro Abs: 3.5 10*3/uL (ref 1.4–7.7)
Neutrophils Relative %: 51.2 % (ref 43.0–77.0)
Platelets: 222 10*3/uL (ref 150.0–400.0)
RBC: 5.58 Mil/uL (ref 4.22–5.81)
RDW: 13.2 % (ref 11.5–15.5)
WBC: 6.8 10*3/uL (ref 4.0–10.5)

## 2020-10-31 LAB — BASIC METABOLIC PANEL
BUN: 13 mg/dL (ref 6–23)
CO2: 29 mEq/L (ref 19–32)
Calcium: 9.9 mg/dL (ref 8.4–10.5)
Chloride: 101 mEq/L (ref 96–112)
Creatinine, Ser: 0.96 mg/dL (ref 0.40–1.50)
GFR: 95.88 mL/min (ref >60.00)
Glucose, Bld: 100 mg/dL — ABNORMAL HIGH (ref 70–99)
Potassium: 4.2 mEq/L (ref 3.5–5.1)
Sodium: 139 mEq/L (ref 135–145)

## 2020-10-31 LAB — URINALYSIS, ROUTINE W REFLEX MICROSCOPIC
Bilirubin Urine: NEGATIVE
Hgb urine dipstick: NEGATIVE
Ketones, ur: NEGATIVE
Leukocytes,Ua: NEGATIVE
Nitrite: NEGATIVE
Specific Gravity, Urine: 1.02 (ref 1.000–1.030)
Total Protein, Urine: NEGATIVE
Urine Glucose: NEGATIVE
Urobilinogen, UA: 0.2 (ref 0.0–1.0)
WBC, UA: NONE SEEN (ref 0–?)
pH: 7 (ref 5.0–8.0)

## 2020-10-31 LAB — URINE CULTURE
MICRO NUMBER:: 11251894
Result:: NO GROWTH
SPECIMEN QUALITY:: ADEQUATE

## 2020-11-01 ENCOUNTER — Encounter: Payer: Self-pay | Admitting: Internal Medicine

## 2020-11-01 NOTE — Assessment & Plan Note (Signed)
Also for urine studies, bmp

## 2020-11-01 NOTE — Assessment & Plan Note (Signed)
Also for urology referral 

## 2020-11-01 NOTE — Assessment & Plan Note (Addendum)
C/w possible renal stone or uti vs other - for ct abd /pelvis  I spent 31 minutes in preparing to see the patient by review of recent labs, imaging and procedures, obtaining and reviewing separately obtained history, communicating with the patient and family or caregiver, ordering medications, tests or procedures, and documenting clinical information in the EHR including the differential Dx, treatment, and any further evaluation and other management of right flank pain, urinary frequency, gross hematuria

## 2020-12-14 DIAGNOSIS — R310 Gross hematuria: Secondary | ICD-10-CM | POA: Diagnosis not present

## 2020-12-14 DIAGNOSIS — N281 Cyst of kidney, acquired: Secondary | ICD-10-CM | POA: Diagnosis not present

## 2020-12-25 ENCOUNTER — Other Ambulatory Visit (HOSPITAL_COMMUNITY): Payer: Self-pay | Admitting: Urology

## 2020-12-25 DIAGNOSIS — N281 Cyst of kidney, acquired: Secondary | ICD-10-CM

## 2020-12-25 DIAGNOSIS — R310 Gross hematuria: Secondary | ICD-10-CM

## 2020-12-29 ENCOUNTER — Ambulatory Visit (HOSPITAL_COMMUNITY)
Admission: RE | Admit: 2020-12-29 | Discharge: 2020-12-29 | Disposition: A | Discharge status: Home / Self Care | Payer: BC Managed Care – PPO | Source: Ambulatory Visit | Attending: Urology | Admitting: Urology

## 2020-12-29 ENCOUNTER — Other Ambulatory Visit: Payer: Self-pay

## 2020-12-29 DIAGNOSIS — N281 Cyst of kidney, acquired: Secondary | ICD-10-CM

## 2020-12-29 DIAGNOSIS — R319 Hematuria, unspecified: Secondary | ICD-10-CM | POA: Diagnosis not present

## 2020-12-29 DIAGNOSIS — R310 Gross hematuria: Secondary | ICD-10-CM | POA: Insufficient documentation

## 2021-06-11 IMAGING — CT CT ABD-PELV W/O CM
1 of 2 series · 14 of 32 positions shown, 19 images · non-contrast
Comparison: None.

CLINICAL DATA: Acute right flank pain.

EXAM:
CT ABDOMEN AND PELVIS WITHOUT CONTRAST
TECHNIQUE: Multidetector CT imaging of the abdomen and pelvis was performed
following the standard protocol without IV contrast.

[Series 2: abd/pelvis w/(date) · axial · 0.71mm/px · z∈[-509,-119]mm · 14 of 88 slices shown, 19 images]
[im 5/88  soft-tissue]
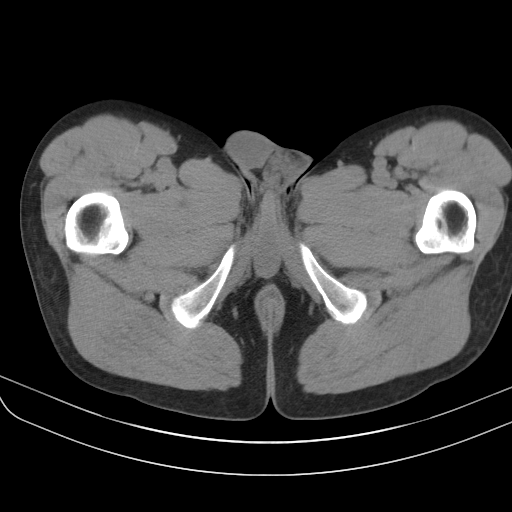
[im 5/88  bone]
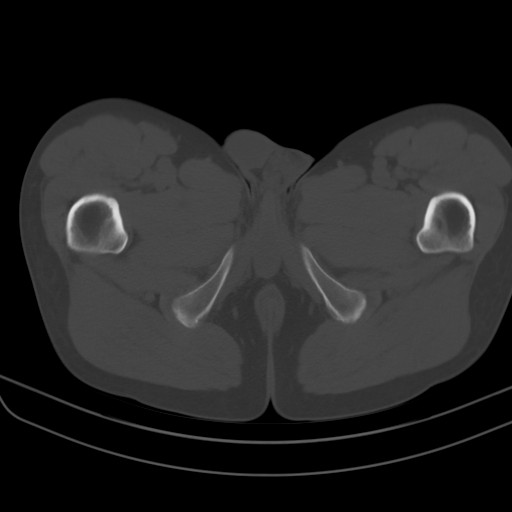
[im 10/88  soft-tissue]
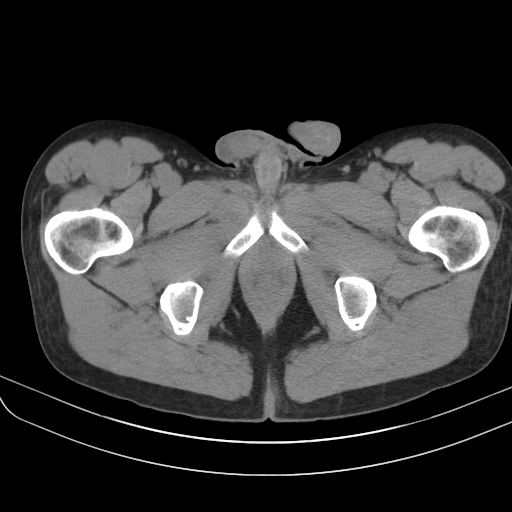
[im 20/88  soft-tissue]
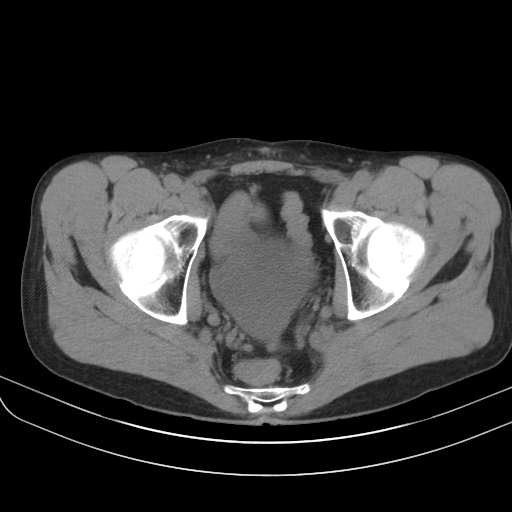
[im 25/88  soft-tissue]
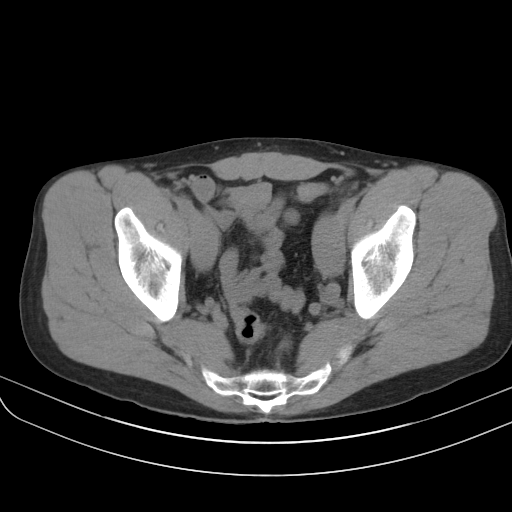
[im 30/88  soft-tissue]
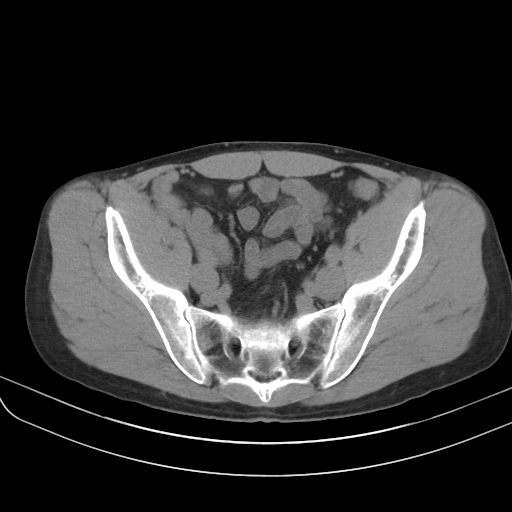
[im 39/88  soft-tissue]
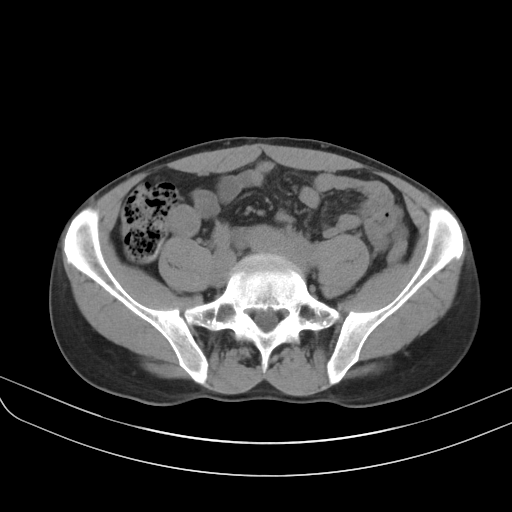
[im 44/88  soft-tissue]
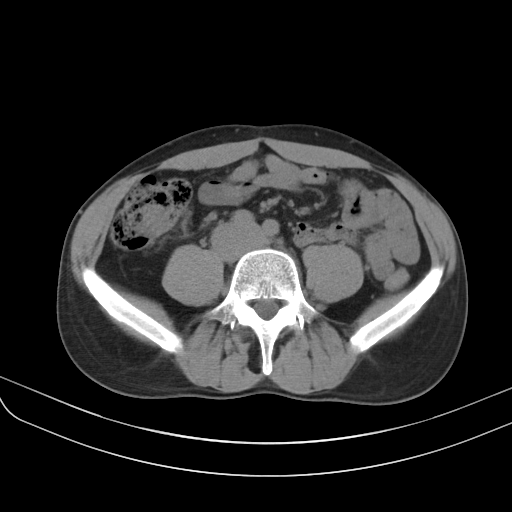
[im 49/88  soft-tissue]
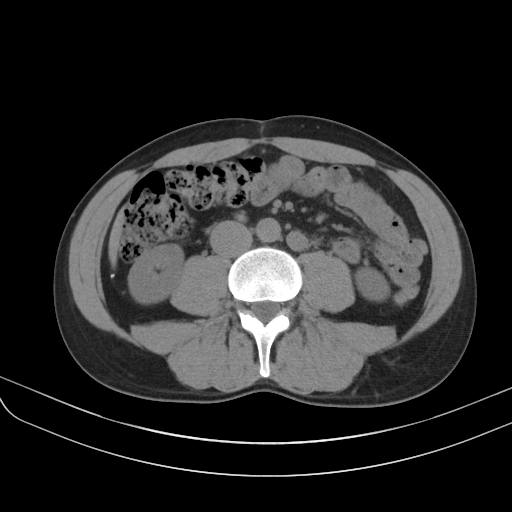
[im 59/88  soft-tissue]
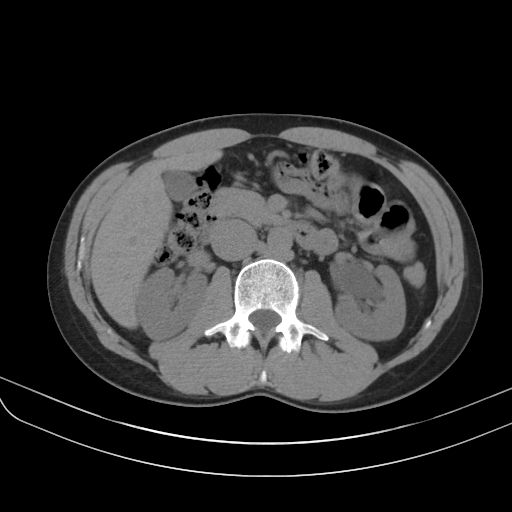
[im 59/88  bone]
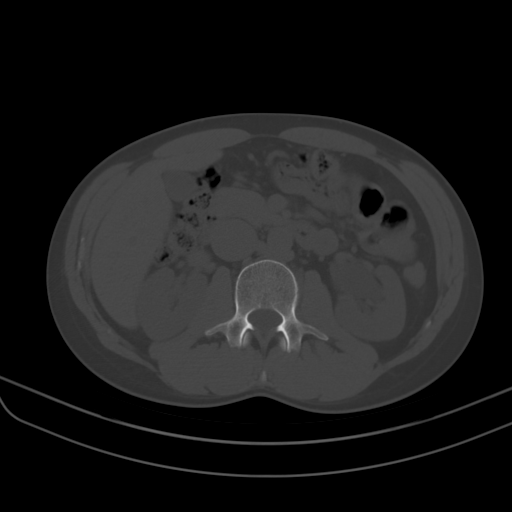
[im 63/88  soft-tissue]
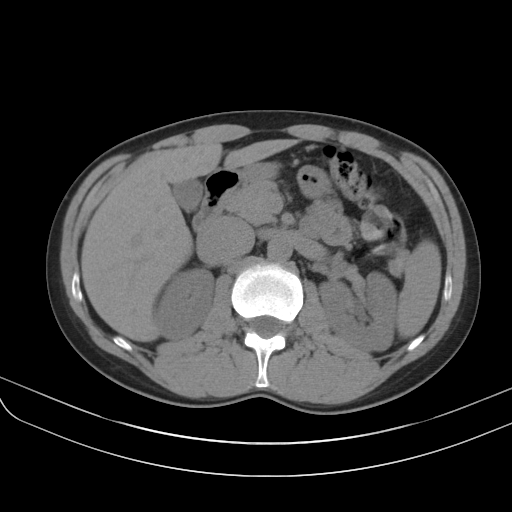
[im 68/88  soft-tissue]
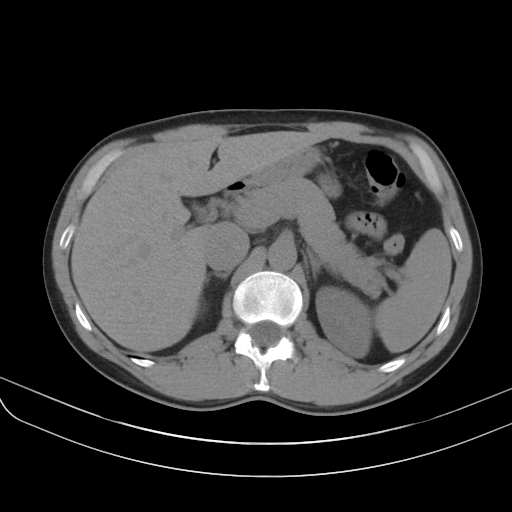
[im 68/88  lung]
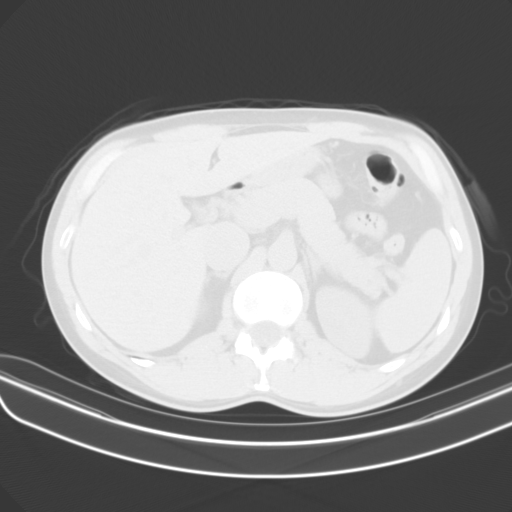
[im 73/88  lung]
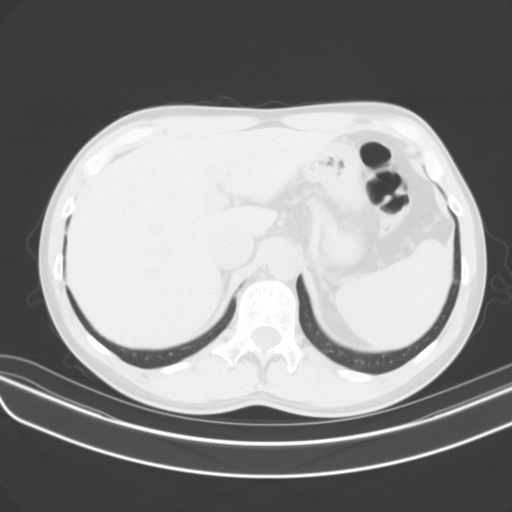
[im 78/88  soft-tissue]
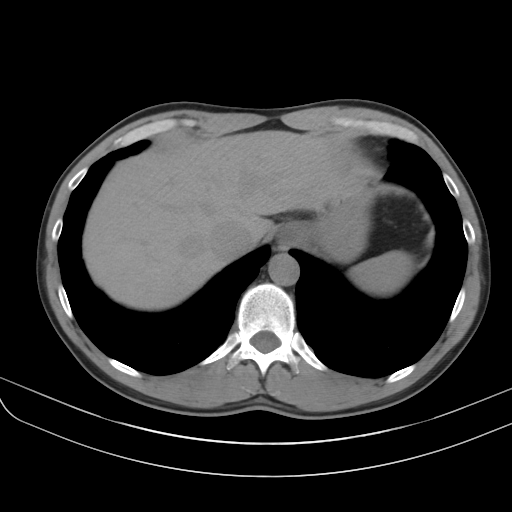
[im 78/88  lung]
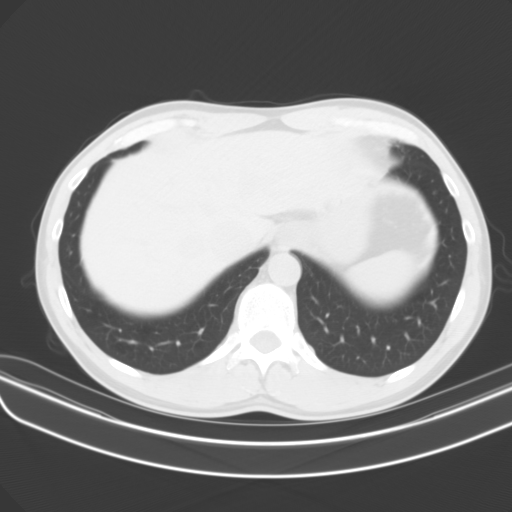
[im 83/88  soft-tissue]
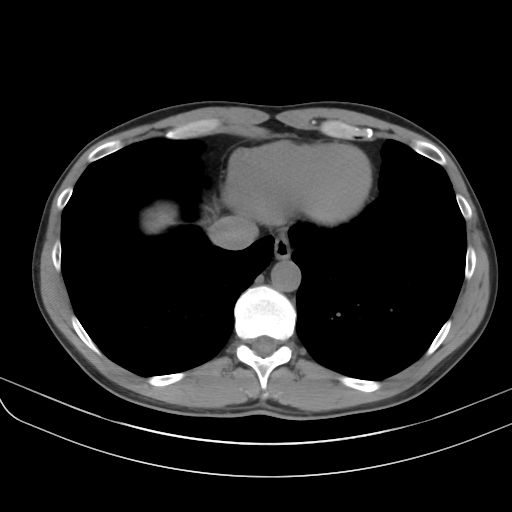
[im 83/88  lung]
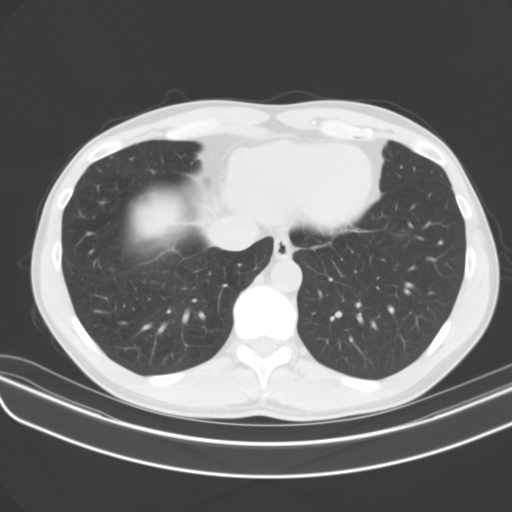

[14 of 32 positions shown; findings below may reference images not displayed]

FINDINGS: Lower chest: No acute abnormality.

Hepatobiliary: No focal liver abnormality is seen. No gallstones,
gallbladder wall thickening, or biliary dilatation.

Pancreas: Unremarkable. No pancreatic ductal dilatation or
surrounding inflammatory changes.

Spleen: Normal in size without focal abnormality.

Adrenals/Urinary Tract: Adrenal glands appear normal. Probable
parapelvic cysts are seen involving the left kidney. No definite
hydronephrosis or renal obstruction is noted. No renal or ureteral
calculi are noted. Urinary bladder is unremarkable.

Stomach/Bowel: The stomach appears normal. Status post appendectomy.
There is no evidence of bowel obstruction or inflammation.

Vascular/Lymphatic: No significant vascular findings are present. No
enlarged abdominal or pelvic lymph nodes.

Reproductive: Prostate is unremarkable.

Other: Small fat containing periumbilical hernia is noted. No
ascites is noted.

Musculoskeletal: No acute or significant osseous findings.
IMPRESSION: 1. Small fat containing periumbilical hernia.
2. Probable parapelvic cysts are seen involving the left kidney. No
definite hydronephrosis or renal obstruction is noted.
3. No renal or ureteral calculi are noted.

## 2021-08-09 IMAGING — US US RENAL
2 series · 14 of 25 positions shown · non-contrast
Comparison: CT 10/31/2020

CLINICAL DATA: Hematuria, renal cyst

EXAM:
RENAL / URINARY TRACT ULTRASOUND COMPLETE

[Series 1: us renal · 13 of 31 slices shown (1 of 2)]
[im 1/31]
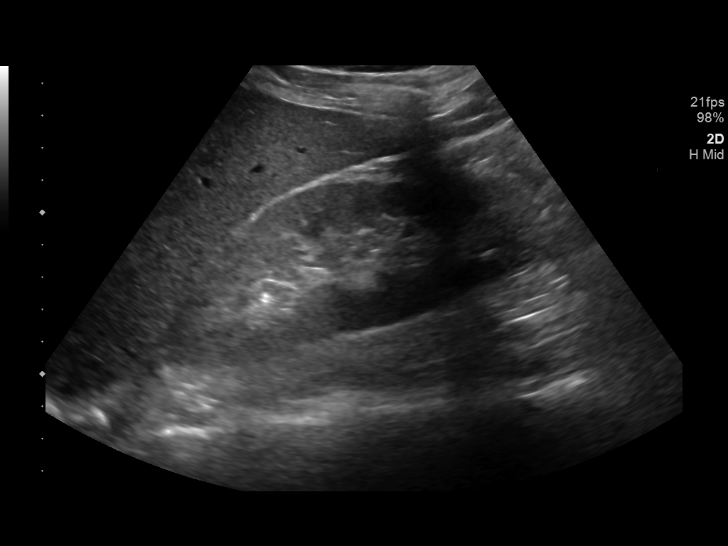
[im 3/31]
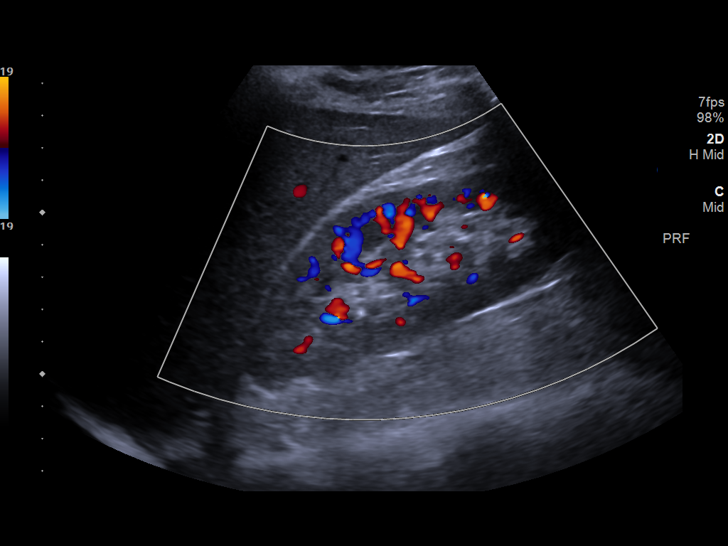
[im 6/31]
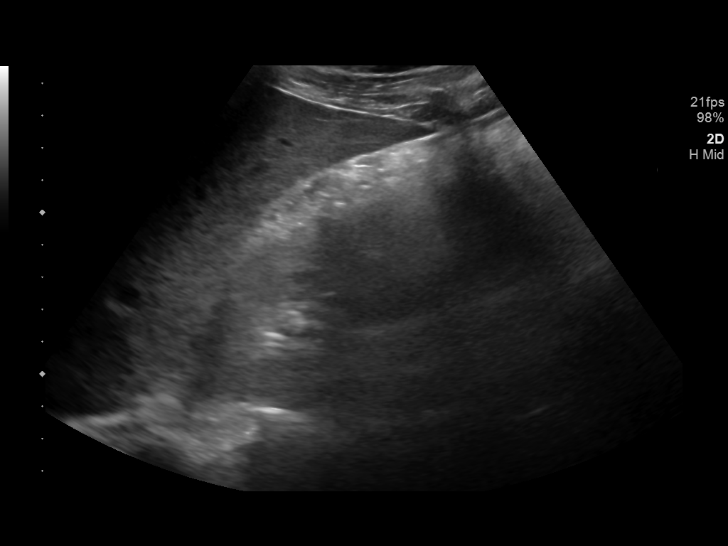
[im 9/31]
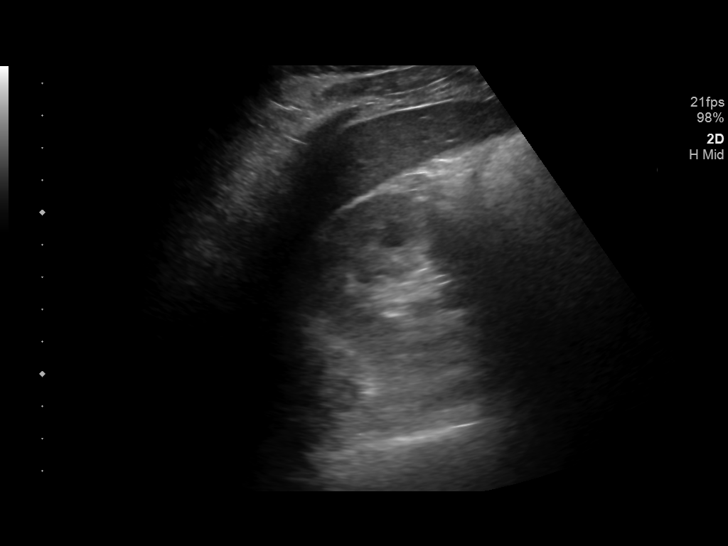
[im 11/31]
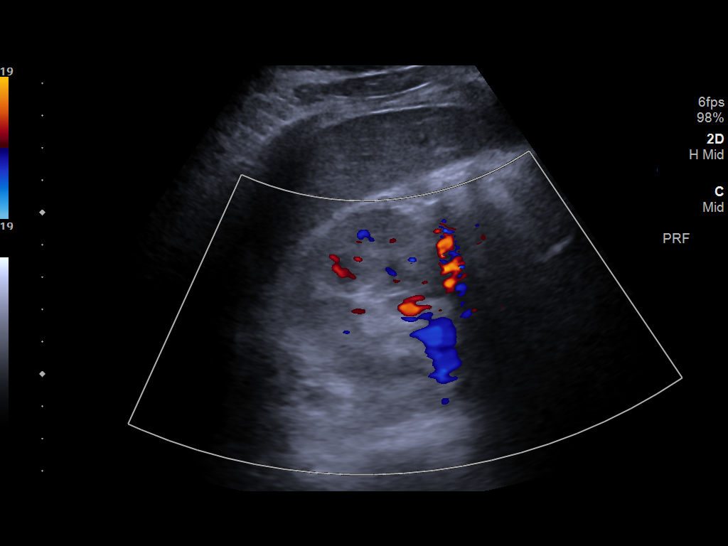
[im 13/31]
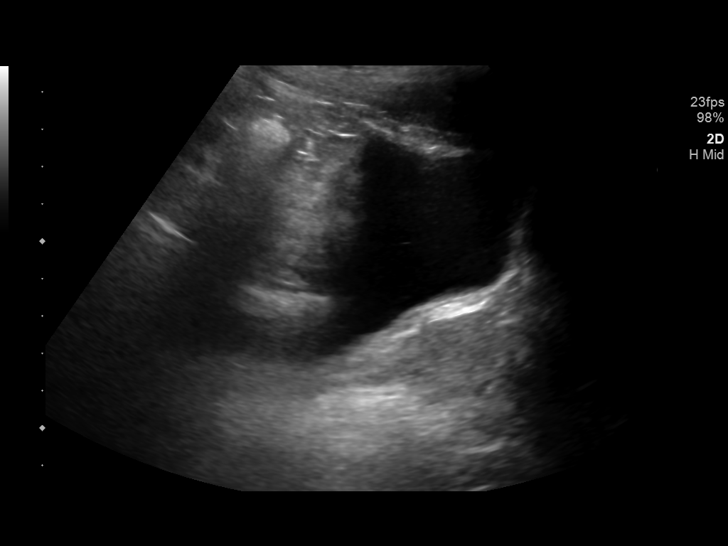
[im 15/31]
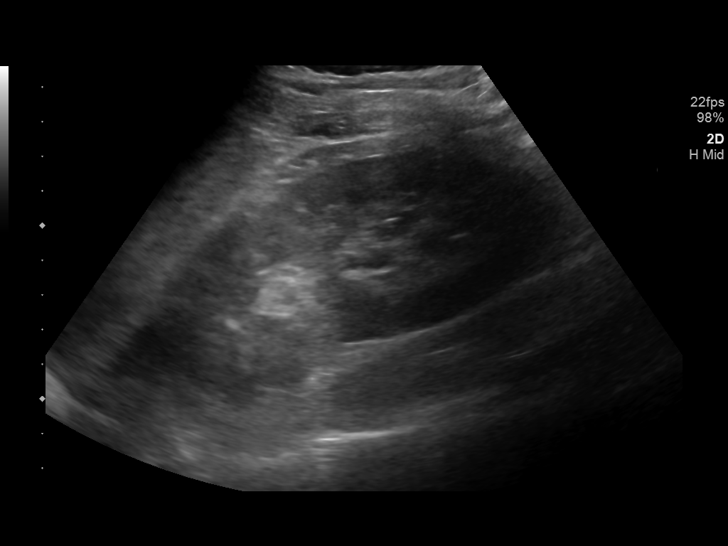
[im 18/31]
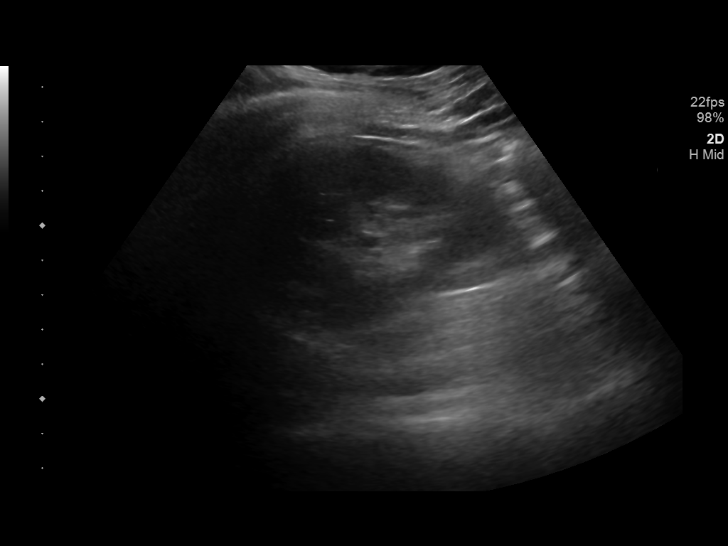
[im 21/31]
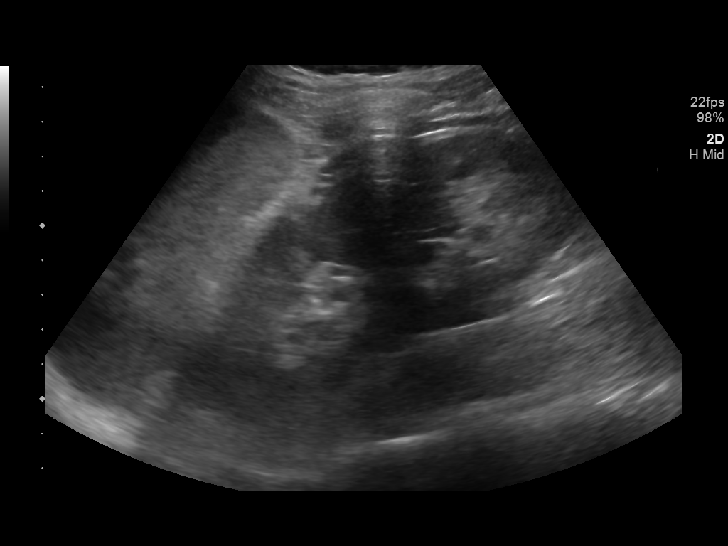
[im 22/31]
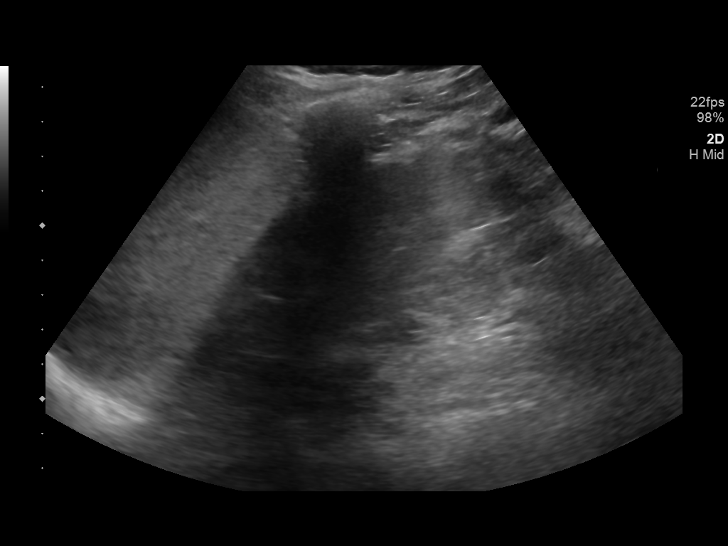
[im 25/31]
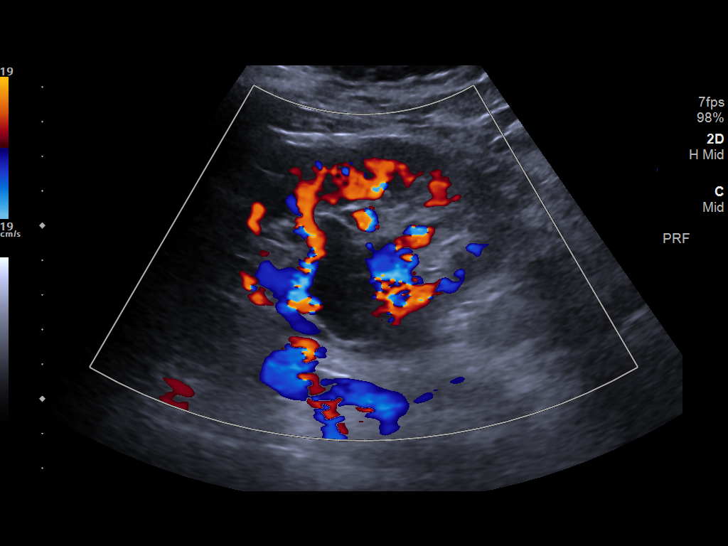
[im 28/31]
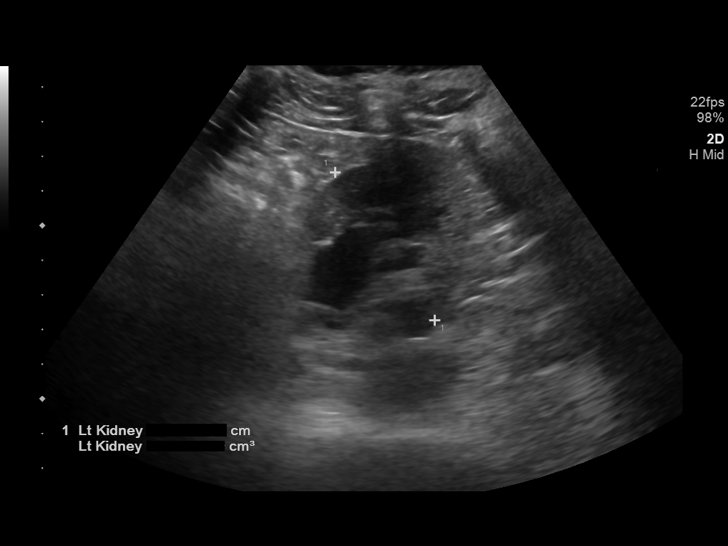
[im 31/31]
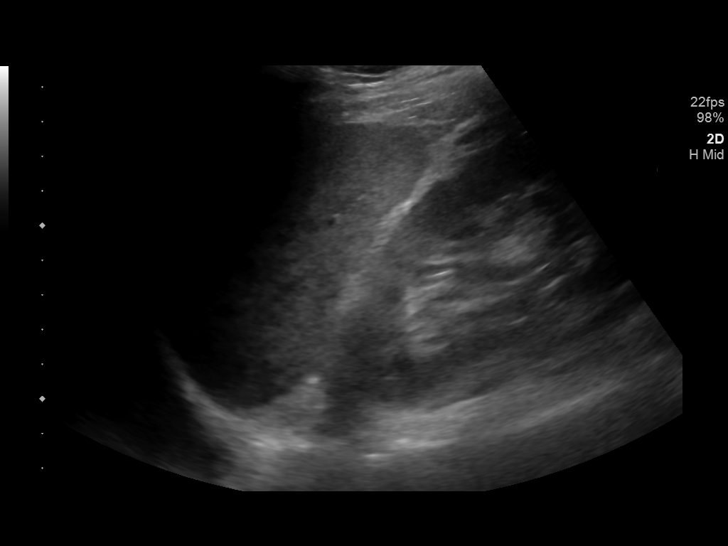

[Series 3: us renal · 1 of 2 slices shown (2 of 2)]
[im 2/2]
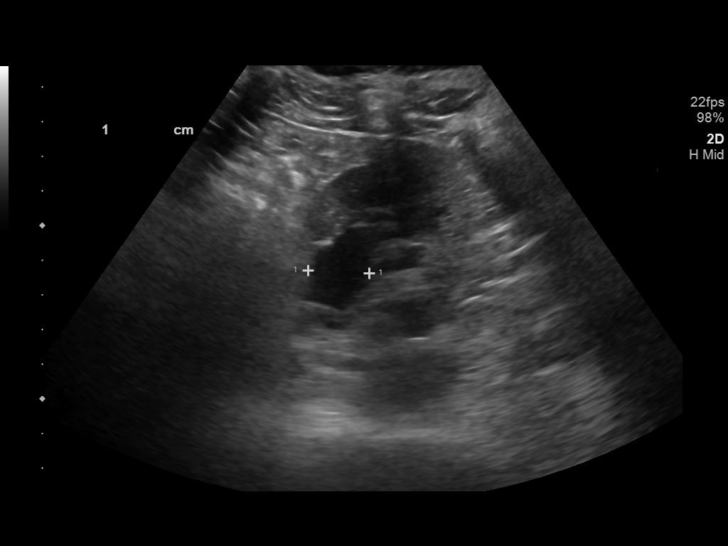

[14 of 25 positions shown; findings below may reference images not displayed]

FINDINGS: Right Kidney:

Renal measurements: 12.0 x 4.7 x 5.6 cm = volume: 164 mL.
Echogenicity within normal limits. No mass or hydronephrosis
visualized.

Left Kidney:

Renal measurements: 13.7 x 5.2 x 5.2 cm = volume: 190 mL. 3.3 cm
parapelvic cyst. No hydronephrosis. Normal echotexture.

Bladder:

Appears normal for degree of bladder distention.

Other:

None.
IMPRESSION: 3.3 cm left renal parapelvic cyst.

No hydronephrosis.

## 2021-10-19 ENCOUNTER — Ambulatory Visit (INDEPENDENT_AMBULATORY_CARE_PROVIDER_SITE_OTHER): Payer: BC Managed Care – PPO | Admitting: Internal Medicine

## 2021-10-19 ENCOUNTER — Other Ambulatory Visit: Payer: Self-pay

## 2021-10-19 VITALS — BP 118/68 | HR 73 | Temp 98.8°F | Ht 72.0 in | Wt 161.0 lb

## 2021-10-19 DIAGNOSIS — Z1211 Encounter for screening for malignant neoplasm of colon: Secondary | ICD-10-CM | POA: Diagnosis not present

## 2021-10-19 DIAGNOSIS — E7800 Pure hypercholesterolemia, unspecified: Secondary | ICD-10-CM | POA: Diagnosis not present

## 2021-10-19 DIAGNOSIS — E538 Deficiency of other specified B group vitamins: Secondary | ICD-10-CM | POA: Diagnosis not present

## 2021-10-19 DIAGNOSIS — E559 Vitamin D deficiency, unspecified: Secondary | ICD-10-CM

## 2021-10-19 DIAGNOSIS — Z0000 Encounter for general adult medical examination without abnormal findings: Secondary | ICD-10-CM

## 2021-10-19 DIAGNOSIS — N200 Calculus of kidney: Secondary | ICD-10-CM

## 2021-10-19 DIAGNOSIS — Z0001 Encounter for general adult medical examination with abnormal findings: Secondary | ICD-10-CM

## 2021-10-19 DIAGNOSIS — R21 Rash and other nonspecific skin eruption: Secondary | ICD-10-CM | POA: Diagnosis not present

## 2021-10-19 MED ORDER — TRIAMCINOLONE ACETONIDE 0.1 % EX CREA: 1.0000 "application " | TOPICAL_CREAM | Freq: Two times a day (BID) | CUTANEOUS | 1 refills | Status: AC

## 2021-10-19 NOTE — Progress Notes (Signed)
Patient ID: Marvin [name], male   DOB: Jul 14, 1976, 45 y.o.   MRN: [id number]         Chief Complaint:: wellness exam and leg rash       HPI:  Marvin [name] is a 45 y.o. male here for wellness exam; due for colonoscopy, declines shingrix,, o/w up to date                        Also has new onset 1 month itchy red rash with multiple spots to right leg lateral upper leg just below the knee, seems to happen about every fall, trying to catch it early this time.  Has not really been working in the yard or other contact.  Pt denies chest pain, increased sob or doe, wheezing, orthopnea, PND, increased LE swelling, palpitations, dizziness or syncope.   Pt denies polydipsia, polyuria, or new focal neuro s/s.   Pt denies fever, wt loss, night sweats, loss of appetite, or other constitutional symptoms  No other new complaints  Saw urology after last visit, exam neg for stone but was presumed to have passed one.   Wt Readings from Last 3 Encounters:  10/19/21 161 lb (73 kg)  10/30/20 153 lb (69.4 kg)  10/17/20 158 lb (71.7 kg)   BP Readings from Last 3 Encounters:  10/19/21 118/68  10/30/20 102/70  10/17/20 110/76   Immunization History  Administered Date(s) Administered   Influenza-Unspecified 09/02/2015, 08/27/2017, 08/24/2018, 09/04/2020, 09/01/2021   PFIZER(Purple Top)SARS-COV-2 Vaccination 03/02/2020, 03/31/2020, 10/16/2020, 08/13/2021   Tdap 07/02/2010, 10/17/2020   Health Maintenance Due  Topic Date Due   COLONOSCOPY (Pts 45-66yrs Insurance coverage will need to be confirmed)  Never done      Past Medical History:  Diagnosis Date   Other and unspecified hyperlipidemia 01/18/2013   Past Surgical History:  Procedure Laterality Date   APPENDECTOMY  2001    reports that he has never smoked. He has never used smokeless tobacco. He reports current alcohol use. He reports that he does not use drugs. family history includes Diabetes in an other family member; Heart disease in an other family  member; Hyperlipidemia in an other family member; Hypertension in an other family member; Prostate cancer in his father; Sudden death in an other family member. No Known Allergies No current outpatient medications on file prior to visit.   No current facility-administered medications on file prior to visit.        ROS:  All others reviewed and negative.  Objective        PE:  BP 118/68   Pulse 73   Temp 98.8 F (37.1 C) (Oral)   Ht 6' (1.829 m)   Wt 161 lb (73 kg)   SpO2 98%   BMI 21.84 kg/m                 Constitutional: Pt appears in NAD               HENT: Head: NCAT.                Right Ear: External ear normal.                 Left Ear: External ear normal.                Eyes: . Pupils are equal, round, and reactive to light. Conjunctivae and EOM are normal  Nose: without d/c or deformity               Neck: Neck supple. Gross normal ROM               Cardiovascular: Normal rate and regular rhythm.                 Pulmonary/Chest: Effort normal and breath sounds without rales or wheezing.                Abd:  Soft, NT, ND, + BS, no organomegaly               Neurological: Pt is alert. At baseline orientation, motor grossly intact               Skin: LE edema - none, right upper lateral leg with multiple itchy red lesions nontender and s/s of cellulitis               Psychiatric: Pt behavior is normal without agitation   Micro: none  Cardiac tracings I have personally interpreted today:  none  Pertinent Radiological findings (summarize): none   Lab Results  Component Value Date   WBC 6.8 10/30/2020   HGB 17.4 (H) 10/30/2020   HCT 50.9 10/30/2020   PLT 222.0 10/30/2020   GLUCOSE 100 (H) 10/30/2020   CHOL 184 02/02/2018   TRIG 68.0 02/02/2018   HDL 54.80 02/02/2018   LDLDIRECT 114.1 01/18/2013   LDLCALC 115 (H) 02/02/2018   ALT 24 02/02/2018   AST 25 02/02/2018   NA 139 10/30/2020   K 4.2 10/30/2020   CL 101 10/30/2020   CREATININE 0.96  10/30/2020   BUN 13 10/30/2020   CO2 29 10/30/2020   TSH 0.93 02/02/2018   PSA 0.58 02/02/2018   Assessment/Plan:  Marvin [name] is a 45 y.o. White or Caucasian [1] male with  has a past medical history of Other and unspecified hyperlipidemia (01/18/2013).  Encounter for well adult exam with abnormal findings Age and sex appropriate education and counseling updated with regular exercise and diet Referrals for preventative services - for colonoscopy Immunizations addressed - none needed Smoking counseling  - none needed Evidence for depression or other mood disorder - none significant Most recent labs reviewed. I have personally reviewed and have noted: 1) the patient's medical and social history 2) The patient's current medications and supplements 3) The patient's height, weight, and BMI have been recorded in the chart   Rash Mild to mod, for triamcinolone cr prn, to f/u any worsening symptoms or concerns  Hyperlipidemia Lab Results  Component Value Date   LDLCALC 115 (H) 02/02/2018   Stable, pt to continue current low chol diet, declines statin  Renal stone  Not proven by Ct but presumed, cont oral fluids,  to f/u any worsening symptoms or concerns  Followup: Return in about 1 year (around 10/19/2022).  Cathlean Cower, MD 10/20/2021 8:21 PM [facility]

## 2021-10-19 NOTE — Patient Instructions (Signed)
Please take all new medication as prescribed - the cream  Please continue all other medications as before, and refills have been done if requested.  Please have the pharmacy call with any other refills you may need.  Please continue your efforts at being more active, low cholesterol diet, and weight control.  You are otherwise up to date with prevention measures today.  Please keep your appointments with your specialists as you may have planned  You will be contacted regarding the referral for: colonoscopy  Please go to the LAB at the blood drawing area for the tests to be done  You will be contacted by phone if any changes need to be made immediately.  Otherwise, you will receive a letter about your results with an explanation, but please check with MyChart first.  Please remember to sign up for MyChart if you have not done so, as this will be important to you in the future with finding out test results, communicating by private email, and scheduling acute appointments online when needed.  Please make an Appointment to return for your 1 year visit, or sooner if needed, with Lab testing by Appointment as well, to be done about 3-5 days before at the [facility] (so this is for TWO appointments - please see the scheduling desk as you leave)   Due to the ongoing Covid 19 pandemic, our lab now requires an appointment for any labs done at our office.  If you need labs done and do not have an appointment, please call our office ahead of time to schedule before presenting to the lab for your testing.

## 2021-10-20 ENCOUNTER — Encounter: Payer: Self-pay | Admitting: Internal Medicine

## 2021-10-20 DIAGNOSIS — N200 Calculus of kidney: Secondary | ICD-10-CM | POA: Insufficient documentation

## 2021-10-20 NOTE — Assessment & Plan Note (Signed)
Not proven by Ct but presumed, cont oral fluids,  to f/u any worsening symptoms or concerns

## 2021-10-20 NOTE — Assessment & Plan Note (Signed)
Mild to mod, for triamcinolone cr prn, to f/u any worsening symptoms or concerns

## 2021-10-20 NOTE — Assessment & Plan Note (Signed)

## 2021-10-20 NOTE — Assessment & Plan Note (Signed)
Lab Results  Component Value Date   LDLCALC 115 (H) 02/02/2018   Stable, pt to continue current low chol diet, declines statin

## 2021-10-23 ENCOUNTER — Other Ambulatory Visit (INDEPENDENT_AMBULATORY_CARE_PROVIDER_SITE_OTHER): Payer: BC Managed Care – PPO

## 2021-10-23 ENCOUNTER — Other Ambulatory Visit: Payer: Self-pay

## 2021-10-23 DIAGNOSIS — E538 Deficiency of other specified B group vitamins: Secondary | ICD-10-CM | POA: Diagnosis not present

## 2021-10-23 DIAGNOSIS — E7800 Pure hypercholesterolemia, unspecified: Secondary | ICD-10-CM | POA: Diagnosis not present

## 2021-10-23 DIAGNOSIS — E559 Vitamin D deficiency, unspecified: Secondary | ICD-10-CM | POA: Diagnosis not present

## 2021-10-23 DIAGNOSIS — Z125 Encounter for screening for malignant neoplasm of prostate: Secondary | ICD-10-CM | POA: Diagnosis not present

## 2021-10-23 DIAGNOSIS — Z0000 Encounter for general adult medical examination without abnormal findings: Secondary | ICD-10-CM

## 2021-10-23 LAB — HEPATIC FUNCTION PANEL
ALT: 46 U/L (ref 0–53)
AST: 31 U/L (ref 0–37)
Albumin: 4.4 g/dL (ref 3.5–5.2)
Alkaline Phosphatase: 62 U/L (ref 39–117)
Bilirubin, Direct: 0.2 mg/dL (ref 0.0–0.3)
Total Bilirubin: 0.9 mg/dL (ref 0.2–1.2)
Total Protein: 6.7 g/dL (ref 6.0–8.3)

## 2021-10-23 LAB — CBC WITH DIFFERENTIAL/PLATELET
Basophils Absolute: 0.1 10*3/uL (ref 0.0–0.1)
Basophils Relative: 0.6 % (ref 0.0–3.0)
Eosinophils Absolute: 0.1 10*3/uL (ref 0.0–0.7)
Eosinophils Relative: 1.2 % (ref 0.0–5.0)
HCT: 46.9 % (ref 39.0–52.0)
Hemoglobin: 16.1 g/dL (ref 13.0–17.0)
Lymphocytes Relative: 23.4 % (ref 12.0–46.0)
Lymphs Abs: 2.2 10*3/uL (ref 0.7–4.0)
MCHC: 34.4 g/dL (ref 30.0–36.0)
MCV: 90.2 fl (ref 78.0–100.0)
Monocytes Absolute: 0.7 10*3/uL (ref 0.1–1.0)
Monocytes Relative: 7.6 % (ref 3.0–12.0)
Neutro Abs: 6.3 10*3/uL (ref 1.4–7.7)
Neutrophils Relative %: 67.2 % (ref 43.0–77.0)
Platelets: 184 10*3/uL (ref 150.0–400.0)
RBC: 5.2 Mil/uL (ref 4.22–5.81)
RDW: 13 % (ref 11.5–15.5)
WBC: 9.4 10*3/uL (ref 4.0–10.5)

## 2021-10-23 LAB — TSH: TSH: 0.98 u[IU]/mL (ref 0.35–5.50)

## 2021-10-23 LAB — BASIC METABOLIC PANEL
BUN: 14 mg/dL (ref 6–23)
CO2: 31 mEq/L (ref 19–32)
Calcium: 9.5 mg/dL (ref 8.4–10.5)
Chloride: 102 mEq/L (ref 96–112)
Creatinine, Ser: 0.94 mg/dL (ref 0.40–1.50)
GFR: 97.66 mL/min (ref >60.00)
Glucose, Bld: 94 mg/dL (ref 70–99)
Potassium: 3.8 mEq/L (ref 3.5–5.1)
Sodium: 139 mEq/L (ref 135–145)

## 2021-10-23 LAB — LIPID PANEL
Cholesterol: 207 mg/dL — ABNORMAL HIGH (ref 0–200)
HDL: 60.6 mg/dL (ref >39.00)
LDL Cholesterol: 127 mg/dL — ABNORMAL HIGH (ref 0–99)
NonHDL: 146.14
Total CHOL/HDL Ratio: 3
Triglycerides: 96 mg/dL (ref 0.0–149.0)
VLDL: 19.2 mg/dL (ref 0.0–40.0)

## 2021-10-23 LAB — VITAMIN B12: Vitamin B-12: 289 pg/mL (ref 211–911)

## 2021-10-23 LAB — VITAMIN D 25 HYDROXY (VIT D DEFICIENCY, FRACTURES): VITD: 26 ng/mL — ABNORMAL LOW (ref 30.00–100.00)

## 2021-10-23 LAB — PSA: PSA: 0.73 ng/mL (ref 0.10–4.00)

## 2021-10-24 ENCOUNTER — Encounter: Payer: Self-pay | Admitting: Internal Medicine

## 2021-10-24 LAB — URINALYSIS, ROUTINE W REFLEX MICROSCOPIC
Bilirubin Urine: NEGATIVE
Hgb urine dipstick: NEGATIVE
Ketones, ur: NEGATIVE
Leukocytes,Ua: NEGATIVE
Nitrite: NEGATIVE
RBC / HPF: NONE SEEN (ref 0–?)
Specific Gravity, Urine: 1.02 (ref 1.000–1.030)
Total Protein, Urine: NEGATIVE
Urine Glucose: NEGATIVE
Urobilinogen, UA: 0.2 (ref 0.0–1.0)
pH: 7 (ref 5.0–8.0)

## 2021-11-16 ENCOUNTER — Encounter: Payer: Self-pay | Admitting: Gastroenterology

## 2021-11-20 ENCOUNTER — Ambulatory Visit (AMBULATORY_SURGERY_CENTER): Payer: BC Managed Care – PPO

## 2021-11-20 ENCOUNTER — Other Ambulatory Visit: Payer: Self-pay

## 2021-11-20 ENCOUNTER — Encounter: Payer: Self-pay | Admitting: Gastroenterology

## 2021-11-20 VITALS — Ht 72.0 in | Wt 160.0 lb

## 2021-11-20 DIAGNOSIS — Z1211 Encounter for screening for malignant neoplasm of colon: Secondary | ICD-10-CM

## 2021-11-20 MED ORDER — NA SULFATE-K SULFATE-MG SULF 17.5-3.13-1.6 GM/177ML PO SOLN
1.0000 | Freq: Once | ORAL | 0 refills | Status: AC
Start: 2021-11-20 — End: 2021-11-20

## 2021-11-20 NOTE — Progress Notes (Signed)

## 2021-11-29 ENCOUNTER — Ambulatory Visit (AMBULATORY_SURGERY_CENTER): Payer: BC Managed Care – PPO | Admitting: Gastroenterology

## 2021-11-29 ENCOUNTER — Encounter: Payer: Self-pay | Admitting: Gastroenterology

## 2021-11-29 ENCOUNTER — Other Ambulatory Visit: Payer: Self-pay

## 2021-11-29 VITALS — BP 94/66 | HR 58 | Temp 98.0°F | Resp 10 | Ht 72.0 in | Wt 160.0 lb

## 2021-11-29 DIAGNOSIS — D122 Benign neoplasm of ascending colon: Secondary | ICD-10-CM

## 2021-11-29 DIAGNOSIS — D124 Benign neoplasm of descending colon: Secondary | ICD-10-CM

## 2021-11-29 DIAGNOSIS — Z1211 Encounter for screening for malignant neoplasm of colon: Secondary | ICD-10-CM

## 2021-11-29 MED ORDER — SODIUM CHLORIDE 0.9 % IV SOLN: 500.0000 mL | Freq: Once | INTRAVENOUS | Status: DC

## 2021-11-29 NOTE — Progress Notes (Signed)
Called to room to assist during endoscopic procedure.  Patient ID and intended procedure confirmed with present staff. Received instructions for my participation in the procedure from the performing physician.  

## 2021-11-29 NOTE — Progress Notes (Signed)
Pt. Held in PACU until fully awake and alert for D/C.

## 2021-11-29 NOTE — Progress Notes (Signed)
Sedate, gd SR, tolerated procedure well, VSS, report to RN 

## 2021-11-29 NOTE — Op Note (Signed)
[facility] Patient Name: Marvin [name] Procedure Date: 11/29/2021 7:25 AM MRN: [id number] Endoscopist: Nicki Reaper E. Candis Schatz , MD Age: 45 Referring MD:  Date of Birth: 1976-09-27 Gender: Male Account #: [id number] Procedure:                Colonoscopy Indications:              Screening for colorectal malignant neoplasm, This                            is the patient's first colonoscopy Medicines:                Monitored Anesthesia Care Procedure:                Pre-Anesthesia Assessment:                           - Prior to the procedure, a History and Physical                            was performed, and patient medications and                            allergies were reviewed. The patient's tolerance of                            previous anesthesia was also reviewed. The risks                            and benefits of the procedure and the sedation                            options and risks were discussed with the patient.                            All questions were answered, and informed consent                            was obtained. Prior Anticoagulants: The patient has                            taken no previous anticoagulant or antiplatelet                            agents. ASA Grade Assessment: II - A patient with                            mild systemic disease. After reviewing the risks                            and benefits, the patient was deemed in                            satisfactory condition to undergo the procedure.  After obtaining informed consent, the colonoscope                            was passed under direct vision. Throughout the                            procedure, the patient's blood pressure, pulse, and                            oxygen saturations were monitored continuously. The                            PCF-HQ190L Colonoscope was introduced through the                            anus and advanced to  the the terminal ileum, with                            identification of the appendiceal orifice and IC                            valve. The colonoscopy was performed without                            difficulty. The patient tolerated the procedure                            well. The quality of the bowel preparation was                            adequate. The terminal ileum, ileocecal valve,                            appendiceal orifice, and rectum were photographed. Scope In: 8:06:04 AM Scope Out: 8:24:47 AM Scope Withdrawal Time: 0 hours 14 minutes 11 seconds  Total Procedure Duration: 0 hours 18 minutes 43 seconds  Findings:                 The perianal and digital rectal examinations were                            normal. Pertinent negatives include normal                            sphincter tone and no palpable rectal lesions.                           A 3 mm polyp was found in the ascending colon. The                            polyp was sessile. The polyp was removed with a                            cold snare. Resection and  retrieval were complete.                            Estimated blood loss was minimal.                           A 4 mm polyp was found in the descending colon. The                            polyp was pedunculated. The polyp was removed with                            a cold snare. Resection and retrieval were                            complete. Estimated blood loss was minimal.                           The exam was otherwise normal throughout the                            examined colon.                           The terminal ileum appeared normal.                           Non-bleeding internal hemorrhoids were found during                            retroflexion. The hemorrhoids were Grade I                            (internal hemorrhoids that do not prolapse).                           No additional abnormalities were found on                             retroflexion. Complications:            No immediate complications. Estimated Blood Loss:     Estimated blood loss was minimal. Impression:               - One 3 mm polyp in the ascending colon, removed                            with a cold snare. Resected and retrieved.                           - One 4 mm polyp in the descending colon, removed                            with a cold snare. Resected and retrieved.                           -  The examined portion of the ileum was normal.                           - Non-bleeding internal hemorrhoids. Recommendation:           - Patient has a contact number available for                            emergencies. The signs and symptoms of potential                            delayed complications were discussed with the                            patient. Return to normal activities tomorrow.                            Written discharge instructions were provided to the                            patient.                           - Resume previous diet.                           - Continue present medications.                           - Await pathology results.                           - Repeat colonoscopy date to be determined after                            pending pathology results are reviewed for                            surveillance. Grae Leathers E. Candis Schatz, MD 11/29/2021 8:29:48 AM This report has been signed electronically.

## 2021-11-29 NOTE — Progress Notes (Signed)
[facility] History and Physical   Primary Care Physician:  Biagio Borg, MD   Reason for Procedure:   Colon cancer screening  Plan:    Screening colonoscopy     HPI: Marvin [name] is a 45 y.o. male undergoing initial average risk screening colonoscopy.  He has no family history of colon cancer and no chronic GI symptoms.    Past Medical History:  Diagnosis Date   Chronic kidney disease    Cyst and kidney stone   Other and unspecified hyperlipidemia 01/18/2013    Past Surgical History:  Procedure Laterality Date   APPENDECTOMY  2001    Prior to Admission medications   Medication Sig Start Date End Date Taking? Authorizing Provider  triamcinolone cream (KENALOG) 0.1 % Apply 1 application topically 2 (two) times daily. 10/19/21 10/19/22 Yes Biagio Borg, MD    Current Outpatient Medications  Medication Sig Dispense Refill   triamcinolone cream (KENALOG) 0.1 % Apply 1 application topically 2 (two) times daily. 30 g 1   Current Facility-Administered Medications  Medication Dose Route Frequency Provider Last Rate Last Admin   0.9 %  sodium chloride infusion  500 mL Intravenous Once Daryel November, MD        Allergies as of 11/29/2021   (No Known Allergies)    Family History  Problem Relation Age of Onset   Prostate cancer Father    Hyperlipidemia Other    Hypertension Other    Heart disease Other    Diabetes Other    Sudden death Other    Colon cancer Neg Hx    Colon polyps Neg Hx    Esophageal cancer Neg Hx    Stomach cancer Neg Hx    Rectal cancer Neg Hx     Social History   Socioeconomic History   Marital status: Married    Spouse name: Not on file   Number of children: Not on file   Years of education: masters   Highest education level: Not on file  Occupational History   Occupation: Business  Tobacco Use   Smoking status: Never   Smokeless tobacco: Never  Vaping Use   Vaping Use: Never used  Substance and Sexual Activity    Alcohol use: Yes    Comment: occasional social   Drug use: No   Sexual activity: Not on file  Other Topics Concern   Not on file  Social History Narrative   Not on file   Social Determinants of Health   Financial Resource Strain: Not on file  Food Insecurity: Not on file  Transportation Needs: Not on file  Physical Activity: Not on file  Stress: Not on file  Social Connections: Not on file  Intimate Partner Violence: Not on file    Review of Systems:  All other review of systems negative except as mentioned in the HPI.  Physical Exam: Vital signs BP 127/73    Pulse 74    Temp 98 F (36.7 C)    Ht 6' (1.829 m)    Wt 160 lb (72.6 kg)    SpO2 97%    BMI 21.70 kg/m   General:   Alert,  Well-developed, well-nourished, pleasant and cooperative in NAD Airway:  Mallampati 2 Lungs:  Clear throughout to auscultation.   Heart:  Regular rate and rhythm; no murmurs, clicks, rubs,  or gallops. Abdomen:  Soft, nontender and nondistended. Normal bowel sounds.   Neuro/Psych:  Normal mood and affect. A and O x 3  Tejay Hubert E. Candis Schatz, MD [facility]

## 2021-11-29 NOTE — Progress Notes (Signed)
VS-CW  Pt's states no medical or surgical changes since previsit or office visit.  

## 2021-11-29 NOTE — Patient Instructions (Signed)
Impression/Recommendations:  Polyp and hemorrhoid handouts given to patient.  Resume previous diet. Continue present medications. Await pathology results.  Repeat colonoscopy date to be determined after pathology results reviewed.  YOU HAD AN ENDOSCOPIC PROCEDURE TODAY AT [facility]:   Refer to the procedure report that was given to you for any specific questions about what was found during the examination.  If the procedure report does not answer your questions, please call your gastroenterologist to clarify.  If you requested that your care partner not be given the details of your procedure findings, then the procedure report has been included in a sealed envelope for you to review at your convenience later.  YOU SHOULD EXPECT: Some feelings of bloating in the abdomen. Passage of more gas than usual.  Walking can help get rid of the air that was put into your GI tract during the procedure and reduce the bloating. If you had a lower endoscopy (such as a colonoscopy or flexible sigmoidoscopy) you may notice spotting of blood in your stool or on the toilet paper. If you underwent a bowel prep for your procedure, you may not have a normal bowel movement for a few days.  Please Note:  You might notice some irritation and congestion in your nose or some drainage.  This is from the oxygen used during your procedure.  There is no need for concern and it should clear up in a day or so.  SYMPTOMS TO REPORT IMMEDIATELY:  Following lower endoscopy (colonoscopy or flexible sigmoidoscopy):  Excessive amounts of blood in the stool  Significant tenderness or worsening of abdominal pains  Swelling of the abdomen that is new, acute  Fever of 100F or higher For urgent or emergent issues, a gastroenterologist can be reached at any hour by calling [phone]. Do not use MyChart messaging for urgent concerns.    DIET:  We do recommend a small meal at first, but then you may proceed to  your regular diet.  Drink plenty of fluids but you should avoid alcoholic beverages for 24 hours.  ACTIVITY:  You should plan to take it easy for the rest of today and you should NOT DRIVE or use heavy machinery until tomorrow (because of the sedation medicines used during the test).    FOLLOW UP: Our staff will call the number listed on your records 48-72 hours following your procedure to check on you and address any questions or concerns that you may have regarding the information given to you following your procedure. If we do not reach you, we will leave a message.  We will attempt to reach you two times.  During this call, we will ask if you have developed any symptoms of COVID 19. If you develop any symptoms (ie: fever, flu-like symptoms, shortness of breath, cough etc.) before then, please call [phone].  If you test positive for Covid 19 in the 2 weeks post procedure, please call and report this information to [place].    If any biopsies were taken you will be contacted by phone or by letter within the next 1-3 weeks.  Please call us at [phone] if you have not heard about the biopsies in 3 weeks.    SIGNATURES/CONFIDENTIALITY: You and/or your care partner have signed paperwork which will be entered into your electronic medical record.  These signatures attest to the fact that that the information above on your After Visit Summary has been reviewed and is understood.  Full responsibility of the confidentiality  of this discharge information lies with you and/or your care-partner.

## 2021-12-05 ENCOUNTER — Telehealth: Payer: Self-pay

## 2021-12-05 ENCOUNTER — Telehealth: Payer: Self-pay | Admitting: *Deleted

## 2021-12-05 NOTE — Telephone Encounter (Signed)
°  Follow up Call-  Call back number 11/29/2021  Post procedure Call Back phone  # [phone]  Permission to leave phone message Yes  Some recent data might be hidden     Patient questions:  Do you have a fever, pain , or abdominal swelling? No. Pain Score  0 *  Have you tolerated food without any problems? Yes.    Have you been able to return to your normal activities? Yes.    Do you have any questions about your discharge instructions: Diet   No. Medications  No. Follow up visit  No.  Do you have questions or concerns about your Care? No.  Actions: * If pain score is 4 or above: No action needed, pain <4.  Have you developed a fever since your procedure? no  2.   Have you had an respiratory symptoms (SOB or cough) since your procedure? no  3.   Have you tested positive for COVID 19 since your procedure no  4.   Have you had any family members/close contacts diagnosed with the COVID 19 since your procedure?  no   If yes to any of these questions please route to Joylene John, RN and Joella Prince, RN

## 2021-12-05 NOTE — Telephone Encounter (Signed)
Left message on follow up call. 

## 2021-12-10 NOTE — Progress Notes (Signed)
Marvin [name],  One polyp which I removed during your recent procedure was proven to be completely benign but is considered a "pre-cancerous" polyp that MAY have grown into cancer if it had not been removed.  Studies shows that at least 20% of women over age 46 and 30% of men over age 66 have pre-cancerous polyps.  Based on current nationally recognized surveillance guidelines, I recommend that you have a repeat colonoscopy in 7 years.   If you develop any new rectal bleeding, abdominal pain or significant bowel habit changes, please contact me before then.

## 2021-12-17 DIAGNOSIS — R310 Gross hematuria: Secondary | ICD-10-CM | POA: Diagnosis not present

## 2021-12-17 DIAGNOSIS — N281 Cyst of kidney, acquired: Secondary | ICD-10-CM | POA: Diagnosis not present

## 2021-12-17 DIAGNOSIS — R3912 Poor urinary stream: Secondary | ICD-10-CM | POA: Diagnosis not present

## 2021-12-17 DIAGNOSIS — N401 Enlarged prostate with lower urinary tract symptoms: Secondary | ICD-10-CM | POA: Diagnosis not present

## 2022-05-30 ENCOUNTER — Ambulatory Visit (INDEPENDENT_AMBULATORY_CARE_PROVIDER_SITE_OTHER): Payer: BC Managed Care – PPO | Admitting: Internal Medicine

## 2022-05-30 ENCOUNTER — Encounter: Payer: Self-pay | Admitting: Internal Medicine

## 2022-05-30 VITALS — BP 108/64 | HR 59 | Ht 72.0 in | Wt 165.4 lb

## 2022-05-30 DIAGNOSIS — H6121 Impacted cerumen, right ear: Secondary | ICD-10-CM | POA: Diagnosis not present

## 2022-05-30 DIAGNOSIS — H900 Conductive hearing loss, bilateral: Secondary | ICD-10-CM | POA: Diagnosis not present

## 2022-05-30 DIAGNOSIS — E7800 Pure hypercholesterolemia, unspecified: Secondary | ICD-10-CM | POA: Diagnosis not present

## 2022-06-02 ENCOUNTER — Encounter: Payer: Self-pay | Admitting: Internal Medicine

## 2022-06-02 DIAGNOSIS — H9193 Unspecified hearing loss, bilateral: Secondary | ICD-10-CM | POA: Insufficient documentation

## 2022-06-02 NOTE — Assessment & Plan Note (Signed)
Lab Results  Component Value Date   LDLCALC 127 (H) 10/23/2021   uncontrolled, pt to continue current lo chol diet, declines statin

## 2022-06-02 NOTE — Assessment & Plan Note (Signed)
1 wk onest reduced hearing loss due to wax impaction, now improved with irrigation  Ceruminosis is noted.  Wax is removed by syringing and manual debridement. Instructions for home care to prevent wax buildup are given.

## 2022-06-02 NOTE — Progress Notes (Signed)
Patient ID: Marvin [name], male   DOB: Dec 14, 1975, 46 y.o.   MRN: [id number]        Chief Complaint: follow up right > left hearing loss, hld       HPI:  Marvin [name] is a 46 y.o. male here with c/o 1 wk onset worsening reduced hearing right more than left, worse after shower with water in ear, later better after lie down but still muffled,  without HA, fever, ST, cough and Pt denies chest pain, increased sob or doe, wheezing, orthopnea, PND, increased LE swelling, palpitations, dizziness or syncope.  Trying to follow lower chol diet       Wt Readings from Last 3 Encounters:  05/30/22 165 lb 6 oz (75 kg)  11/29/21 160 lb (72.6 kg)  11/20/21 160 lb (72.6 kg)   BP Readings from Last 3 Encounters:  05/30/22 108/64  11/29/21 94/66  10/19/21 118/68         Past Medical History:  Diagnosis Date   Chronic kidney disease    Cyst and kidney stone   Other and unspecified hyperlipidemia 01/18/2013   Past Surgical History:  Procedure Laterality Date   APPENDECTOMY  2001    reports that he has never smoked. He has never used smokeless tobacco. He reports current alcohol use. He reports that he does not use drugs. family history includes Diabetes in an other family member; Heart disease in an other family member; Hyperlipidemia in an other family member; Hypertension in an other family member; Prostate cancer in his father; Sudden death in an other family member. No Known Allergies Current Outpatient Medications on File Prior to Visit  Medication Sig Dispense Refill   triamcinolone cream (KENALOG) 0.1 % Apply 1 application topically 2 (two) times daily. (Patient not taking: Reported on 05/30/2022) 30 g 1   No current facility-administered medications on file prior to visit.        ROS:  All others reviewed and negative.  Objective        PE:  BP 108/64   Pulse (!) 59   Ht 6' (1.829 m)   Wt 165 lb 6 oz (75 kg)   SpO2 96%   BMI 22.43 kg/m                 Constitutional: Pt appears in  NAD               HENT: Head: NCAT.                Right Ear: External ear normal.                 Left Ear: External ear normal. Bilateral wax impactions resolved with irrigation, hearing improved               Eyes: . Pupils are equal, round, and reactive to light. Conjunctivae and EOM are normal               Nose: without d/c or deformity               Neck: Neck supple. Gross normal ROM               Cardiovascular: Normal rate and regular rhythm.                 Pulmonary/Chest: Effort normal and breath sounds without rales or wheezing.                Abd:  Soft, NT, ND, +  BS, no organomegaly               Neurological: Pt is alert. At baseline orientation, motor grossly intact               Skin: Skin is warm. No rashes, no other new lesions, LE edema - none               Psychiatric: Pt behavior is normal without agitation   Micro: none  Cardiac tracings I have personally interpreted today:  none  Pertinent Radiological findings (summarize): none   Lab Results  Component Value Date   WBC 9.4 10/23/2021   HGB 16.1 10/23/2021   HCT 46.9 10/23/2021   PLT 184.0 10/23/2021   GLUCOSE 94 10/23/2021   CHOL 207 (H) 10/23/2021   TRIG 96.0 10/23/2021   HDL 60.60 10/23/2021   LDLDIRECT 114.1 01/18/2013   LDLCALC 127 (H) 10/23/2021   ALT 46 10/23/2021   AST 31 10/23/2021   NA 139 10/23/2021   K 3.8 10/23/2021   CL 102 10/23/2021   CREATININE 0.94 10/23/2021   BUN 14 10/23/2021   CO2 31 10/23/2021   TSH 0.98 10/23/2021   PSA 0.73 10/23/2021   Assessment/Plan:  Marvin [name] is a 46 y.o. White or Caucasian [1] male with  has a past medical history of Chronic kidney disease and Other and unspecified hyperlipidemia (01/18/2013).  Bilateral hearing loss 1 wk onest reduced hearing loss due to wax impaction, now improved with irrigation  Ceruminosis is noted.  Wax is removed by syringing and manual debridement. Instructions for home care to prevent wax buildup are  given.  Hyperlipidemia Lab Results  Component Value Date   LDLCALC 127 (H) 10/23/2021   uncontrolled, pt to continue current lo chol diet, declines statin  Followup: Return if symptoms worsen or fail to improve.  Cathlean Cower, MD 06/02/2022 11:10 AM [facility]

## 2022-06-02 NOTE — Patient Instructions (Signed)
Your wax impactions were irrigated today  Please continue all other medications as before, and refills have been done if requested.  Please have the pharmacy call with any other refills you may need.  Please continue your efforts at being more active, low cholesterol diet, and weight control.  Please keep your appointments with your specialists as you may have planned

## 2022-09-17 DIAGNOSIS — L821 Other seborrheic keratosis: Secondary | ICD-10-CM | POA: Diagnosis not present

## 2022-09-17 DIAGNOSIS — D2362 Other benign neoplasm of skin of left upper limb, including shoulder: Secondary | ICD-10-CM | POA: Diagnosis not present

## 2022-09-17 DIAGNOSIS — L814 Other melanin hyperpigmentation: Secondary | ICD-10-CM | POA: Diagnosis not present

## 2022-09-17 DIAGNOSIS — D1801 Hemangioma of skin and subcutaneous tissue: Secondary | ICD-10-CM | POA: Diagnosis not present

## 2022-09-17 DIAGNOSIS — L820 Inflamed seborrheic keratosis: Secondary | ICD-10-CM | POA: Diagnosis not present

## 2022-10-21 ENCOUNTER — Ambulatory Visit (INDEPENDENT_AMBULATORY_CARE_PROVIDER_SITE_OTHER): Payer: BC Managed Care – PPO | Admitting: Internal Medicine

## 2022-10-21 ENCOUNTER — Encounter: Payer: Self-pay | Admitting: Internal Medicine

## 2022-10-21 VITALS — BP 108/62 | HR 80 | Temp 98.9°F | Ht 72.0 in | Wt 164.0 lb

## 2022-10-21 DIAGNOSIS — E7800 Pure hypercholesterolemia, unspecified: Secondary | ICD-10-CM | POA: Diagnosis not present

## 2022-10-21 DIAGNOSIS — R739 Hyperglycemia, unspecified: Secondary | ICD-10-CM | POA: Diagnosis not present

## 2022-10-21 DIAGNOSIS — Z0001 Encounter for general adult medical examination with abnormal findings: Secondary | ICD-10-CM

## 2022-10-21 DIAGNOSIS — R7989 Other specified abnormal findings of blood chemistry: Secondary | ICD-10-CM | POA: Insufficient documentation

## 2022-10-21 DIAGNOSIS — R9431 Abnormal electrocardiogram [ECG] [EKG]: Secondary | ICD-10-CM

## 2022-10-21 DIAGNOSIS — E538 Deficiency of other specified B group vitamins: Secondary | ICD-10-CM | POA: Diagnosis not present

## 2022-10-21 DIAGNOSIS — Z125 Encounter for screening for malignant neoplasm of prostate: Secondary | ICD-10-CM

## 2022-10-21 DIAGNOSIS — E559 Vitamin D deficiency, unspecified: Secondary | ICD-10-CM

## 2022-10-21 DIAGNOSIS — R21 Rash and other nonspecific skin eruption: Secondary | ICD-10-CM

## 2022-10-21 LAB — CBC WITH DIFFERENTIAL/PLATELET
Basophils Absolute: 0 10*3/uL (ref 0.0–0.1)
Basophils Relative: 0.6 % (ref 0.0–3.0)
Eosinophils Absolute: 0.1 10*3/uL (ref 0.0–0.7)
Eosinophils Relative: 0.7 % (ref 0.0–5.0)
HCT: 47.6 % (ref 39.0–52.0)
Hemoglobin: 16.8 g/dL (ref 13.0–17.0)
Lymphocytes Relative: 31.3 % (ref 12.0–46.0)
Lymphs Abs: 2.5 10*3/uL (ref 0.7–4.0)
MCHC: 35.2 g/dL (ref 30.0–36.0)
MCV: 90.2 fl (ref 78.0–100.0)
Monocytes Absolute: 0.8 10*3/uL (ref 0.1–1.0)
Monocytes Relative: 9.6 % (ref 3.0–12.0)
Neutro Abs: 4.6 10*3/uL (ref 1.4–7.7)
Neutrophils Relative %: 57.8 % (ref 43.0–77.0)
Platelets: 214 10*3/uL (ref 150.0–400.0)
RBC: 5.27 Mil/uL (ref 4.22–5.81)
RDW: 12.9 % (ref 11.5–15.5)
WBC: 8 10*3/uL (ref 4.0–10.5)

## 2022-10-21 LAB — TSH: TSH: 0.99 u[IU]/mL (ref 0.35–5.50)

## 2022-10-21 LAB — PSA: PSA: 0.59 ng/mL (ref 0.10–4.00)

## 2022-10-21 LAB — URINALYSIS, ROUTINE W REFLEX MICROSCOPIC
Bilirubin Urine: NEGATIVE
Hgb urine dipstick: NEGATIVE
Ketones, ur: NEGATIVE
Leukocytes,Ua: NEGATIVE
Nitrite: NEGATIVE
RBC / HPF: NONE SEEN (ref 0–?)
Specific Gravity, Urine: >=1.03 — AB (ref 1.000–1.030)
Total Protein, Urine: NEGATIVE
Urine Glucose: NEGATIVE
Urobilinogen, UA: 0.2 (ref 0.0–1.0)
pH: 5.5 (ref 5.0–8.0)

## 2022-10-21 LAB — VITAMIN D 25 HYDROXY (VIT D DEFICIENCY, FRACTURES): VITD: 23.87 ng/mL — ABNORMAL LOW (ref 30.00–100.00)

## 2022-10-21 LAB — VITAMIN B12: Vitamin B-12: 177 pg/mL — ABNORMAL LOW (ref 211–911)

## 2022-10-21 MED ORDER — TRIAMCINOLONE ACETONIDE 0.5 % EX CREA: 1.0000 | TOPICAL_CREAM | Freq: Three times a day (TID) | CUTANEOUS | 1 refills | Status: AC

## 2022-10-21 NOTE — Assessment & Plan Note (Signed)
Last vitamin D Lab Results  Component Value Date   VD25OH 26.00 (L) 10/23/2021   Low, to start oral replacement

## 2022-10-21 NOTE — Assessment & Plan Note (Signed)

## 2022-10-21 NOTE — Assessment & Plan Note (Signed)
Mild, for steroid cream refill - triam cr prn asd,  to f/u any worsening symptoms or concerns

## 2022-10-21 NOTE — Patient Instructions (Addendum)
Please take OTC Vitamin D3 at 2000 units per day, indefinitely  You will be contacted regarding the referral for: Cardiac CT score  Please continue all other medications as before, and refills have been done if requested - the cream  Please have the pharmacy call with any other refills you may need.  Please continue your efforts at being more active, low cholesterol diet, and weight control.  You are otherwise up to date with prevention measures today.  Please keep your appointments with your specialists as you may have planned  Please go to the LAB at the blood drawing area for the tests to be done  You will be contacted by phone if any changes need to be made immediately.  Otherwise, you will receive a letter about your results with an explanation, but please check with MyChart first.  Please remember to sign up for MyChart if you have not done so, as this will be important to you in the future with finding out test results, communicating by private email, and scheduling acute appointments online when needed.  Please make an Appointment to return for your 1 year visit, or sooner if needed, with Lab testing by Appointment as well, to be done about 3-5 days before at the [facility] (so this is for TWO appointments - please see the scheduling desk as you leave)

## 2022-10-21 NOTE — Assessment & Plan Note (Signed)
Lab Results  Component Value Date   LDLCALC 127 (H) 10/23/2021   Uncontrolled, goal ldl < 70,, pt to continue current low chol diet, declines statin for now but for Cardiac CT score

## 2022-10-21 NOTE — Progress Notes (Signed)
Patient ID: Marvin [name], male   DOB: [birth year], 46 y.o.   MRN: [id number]         Chief Complaint:: wellness exam and Physical (No concerns or questions)         HPI:  Marvin [name] is a 46 y.o. male here for wellness exam; up to date                      Also not taking vit d.  Willing for cardiac CT score, o/w declines statin for now, Pt denies chest pain, increased sob or doe, wheezing, orthopnea, PND, increased LE swelling, palpitations, dizziness or syncope.   Pt denies polydipsia, polyuria, or new focal neuro s/s.   Has a rash to right lateral mid lower leg with itching, needs steroid cream refill.    Wt Readings from Last 3 Encounters:  10/21/22 164 lb (74.4 kg)  05/30/22 165 lb 6 oz (75 kg)  11/29/21 160 lb (72.6 kg)   BP Readings from Last 3 Encounters:  10/21/22 108/62  05/30/22 108/64  11/29/21 94/66   Immunization History  Administered Date(s) Administered   Influenza-Unspecified 09/02/2015, 08/27/2017, 08/24/2018, 09/04/2020, 09/01/2021, 08/20/2022   PFIZER(Purple Top)SARS-COV-2 Vaccination 03/02/2020, 03/31/2020, 10/16/2020, 08/13/2021, 09/01/2022   Tdap 07/02/2010, 10/17/2020   There are no preventive care reminders to display for this patient.     Past Medical History:  Diagnosis Date   Chronic kidney disease    Cyst and kidney stone   Other and unspecified hyperlipidemia 01/18/2013   Past Surgical History:  Procedure Laterality Date   APPENDECTOMY  2001    reports that he has never smoked. He has never used smokeless tobacco. He reports current alcohol use. He reports that he does not use drugs. family history includes Diabetes in an other family member; Heart disease in an other family member; Hyperlipidemia in an other family member; Hypertension in an other family member; Prostate cancer in his father; Sudden death in an other family member. No Known Allergies No current outpatient medications on file prior to visit.   No current facility-administered  medications on file prior to visit.        ROS:  All others reviewed and negative.  Objective        PE:  BP 108/62 (BP Location: Right Arm, Patient Position: Sitting, Cuff Size: Large)   Pulse 80   Temp 98.9 F (37.2 C) (Oral)   Ht 6' (1.829 m)   Wt 164 lb (74.4 kg)   SpO2 94%   BMI 22.24 kg/m                 Constitutional: Pt appears in NAD               HENT: Head: NCAT.                Right Ear: External ear normal.                 Left Ear: External ear normal.                Eyes: . Pupils are equal, round, and reactive to light. Conjunctivae and EOM are normal               Nose: without d/c or deformity               Neck: Neck supple. Gross normal ROM               Cardiovascular:  Normal rate and regular rhythm.                 Pulmonary/Chest: Effort normal and breath sounds without rales or wheezing.                Abd:  Soft, NT, ND, + BS, no organomegaly               Neurological: Pt is alert. At baseline orientation, motor grossly intact               Skin: Skin is warm. Right mid lower lateral leg with 1 cm area scaly erythema, LE edema - none               Psychiatric: Pt behavior is normal without agitation   Micro: none  Cardiac tracings I have personally interpreted today:  none  Pertinent Radiological findings (summarize): none   Lab Results  Component Value Date   WBC 9.4 10/23/2021   HGB 16.1 10/23/2021   HCT 46.9 10/23/2021   PLT 184.0 10/23/2021   GLUCOSE 94 10/23/2021   CHOL 207 (H) 10/23/2021   TRIG 96.0 10/23/2021   HDL 60.60 10/23/2021   LDLDIRECT 114.1 01/18/2013   LDLCALC 127 (H) 10/23/2021   ALT 46 10/23/2021   AST 31 10/23/2021   NA 139 10/23/2021   K 3.8 10/23/2021   CL 102 10/23/2021   CREATININE 0.94 10/23/2021   BUN 14 10/23/2021   CO2 31 10/23/2021   TSH 0.98 10/23/2021   PSA 0.73 10/23/2021   Assessment/Plan:  Marvin [name] is a 46 y.o. White or Caucasian [1] male with  has a past medical history of Chronic kidney  disease and Other and unspecified hyperlipidemia (01/18/2013).  Encounter for well adult exam with abnormal findings Age and sex appropriate education and counseling updated with regular exercise and diet Referrals for preventative services - none needed Immunizations addressed - none needed Smoking counseling  - none needed Evidence for depression or other mood disorder - none significant Most recent labs reviewed. I have personally reviewed and have noted: 1) the patient's medical and social history 2) The patient's current medications and supplements 3) The patient's height, weight, and BMI have been recorded in the chart   Rash Mild, for steroid cream refill - triam cr prn asd,  to f/u any worsening symptoms or concerns   Hyperlipidemia Lab Results  Component Value Date   LDLCALC 127 (H) 10/23/2021   Uncontrolled, goal ldl < 70,, pt to continue current low chol diet, declines statin for now but for Cardiac CT score   Low vitamin D level Last vitamin D Lab Results  Component Value Date   VD25OH 26.00 (L) 10/23/2021   Low, to start oral replacement  Followup: Return in about 1 year (around 10/22/2023).  Cathlean Cower, MD 10/21/2022 5:01 PM [facility]`

## 2022-10-22 ENCOUNTER — Encounter: Payer: Self-pay | Admitting: Internal Medicine

## 2022-10-22 DIAGNOSIS — E538 Deficiency of other specified B group vitamins: Secondary | ICD-10-CM | POA: Insufficient documentation

## 2022-10-22 LAB — BASIC METABOLIC PANEL
BUN: 15 mg/dL (ref 6–23)
CO2: 27 mEq/L (ref 19–32)
Calcium: 9.5 mg/dL (ref 8.4–10.5)
Chloride: 102 mEq/L (ref 96–112)
Creatinine, Ser: 0.93 mg/dL (ref 0.40–1.50)
GFR: 98.23 mL/min (ref >60.00)
Glucose, Bld: 59 mg/dL — ABNORMAL LOW (ref 70–99)
Potassium: 4 mEq/L (ref 3.5–5.1)
Sodium: 139 mEq/L (ref 135–145)

## 2022-10-22 LAB — LIPID PANEL
Cholesterol: 217 mg/dL — ABNORMAL HIGH (ref 0–200)
HDL: 58.2 mg/dL (ref >39.00)
LDL Cholesterol: 138 mg/dL — ABNORMAL HIGH (ref 0–99)
NonHDL: 159.08
Total CHOL/HDL Ratio: 4
Triglycerides: 107 mg/dL (ref 0.0–149.0)
VLDL: 21.4 mg/dL (ref 0.0–40.0)

## 2022-10-22 LAB — HEPATIC FUNCTION PANEL
ALT: 35 U/L (ref 0–53)
AST: 25 U/L (ref 0–37)
Albumin: 4.6 g/dL (ref 3.5–5.2)
Alkaline Phosphatase: 59 U/L (ref 39–117)
Bilirubin, Direct: 0.2 mg/dL (ref 0.0–0.3)
Total Bilirubin: 1.1 mg/dL (ref 0.2–1.2)
Total Protein: 7 g/dL (ref 6.0–8.3)

## 2022-11-08 ENCOUNTER — Ambulatory Visit (HOSPITAL_COMMUNITY)
Admission: RE | Admit: 2022-11-08 | Discharge: 2022-11-08 | Disposition: A | Discharge status: Home / Self Care | Payer: BC Managed Care – PPO | Source: Ambulatory Visit | Attending: Internal Medicine | Admitting: Internal Medicine

## 2022-11-08 ENCOUNTER — Encounter (HOSPITAL_COMMUNITY): Payer: Self-pay

## 2022-11-08 DIAGNOSIS — E7800 Pure hypercholesterolemia, unspecified: Secondary | ICD-10-CM

## 2022-11-08 DIAGNOSIS — R9431 Abnormal electrocardiogram [ECG] [EKG]: Secondary | ICD-10-CM | POA: Insufficient documentation

## 2022-11-10 ENCOUNTER — Other Ambulatory Visit: Payer: Self-pay | Admitting: Internal Medicine

## 2022-11-10 MED ORDER — ROSUVASTATIN CALCIUM 20 MG PO TABS: 20.0000 mg | ORAL_TABLET | Freq: Every day | ORAL | 3 refills | Status: DC

## 2022-11-10 MED ORDER — ASPIRIN 81 MG PO TBEC: 81.0000 mg | DELAYED_RELEASE_TABLET | Freq: Every day | ORAL | 12 refills | Status: AC

## 2022-11-16 ENCOUNTER — Other Ambulatory Visit: Payer: Self-pay

## 2022-11-16 ENCOUNTER — Emergency Department (HOSPITAL_COMMUNITY)
Admission: EM | Admit: 2022-11-16 | Discharge: 2022-11-17 | Disposition: A | Discharge status: Home / Self Care | Payer: BC Managed Care – PPO | Attending: Emergency Medicine | Admitting: Emergency Medicine

## 2022-11-16 ENCOUNTER — Emergency Department (HOSPITAL_COMMUNITY): Payer: BC Managed Care – PPO

## 2022-11-16 ENCOUNTER — Encounter (HOSPITAL_COMMUNITY): Payer: Self-pay

## 2022-11-16 DIAGNOSIS — R079 Chest pain, unspecified: Secondary | ICD-10-CM | POA: Diagnosis not present

## 2022-11-16 DIAGNOSIS — N189 Chronic kidney disease, unspecified: Secondary | ICD-10-CM | POA: Insufficient documentation

## 2022-11-16 DIAGNOSIS — Z7982 Long term (current) use of aspirin: Secondary | ICD-10-CM | POA: Diagnosis not present

## 2022-11-16 DIAGNOSIS — R0789 Other chest pain: Secondary | ICD-10-CM | POA: Insufficient documentation

## 2022-11-16 DIAGNOSIS — R42 Dizziness and giddiness: Secondary | ICD-10-CM | POA: Diagnosis not present

## 2022-11-16 LAB — CBC
HCT: 47 % (ref 39.0–52.0)
Hemoglobin: 16.7 g/dL (ref 13.0–17.0)
MCH: 31.7 pg (ref 26.0–34.0)
MCHC: 35.5 g/dL (ref 30.0–36.0)
MCV: 89.4 fL (ref 80.0–100.0)
Platelets: 202 10*3/uL (ref 150–400)
RBC: 5.26 MIL/uL (ref 4.22–5.81)
RDW: 11.9 % (ref 11.5–15.5)
WBC: 7 10*3/uL (ref 4.0–10.5)
nRBC: 0 % (ref 0.0–0.2)

## 2022-11-16 LAB — TROPONIN I (HIGH SENSITIVITY): Troponin I (High Sensitivity): 5 ng/L (ref <18)

## 2022-11-16 LAB — BASIC METABOLIC PANEL
Anion gap: 8 (ref 5–15)
BUN: 15 mg/dL (ref 6–20)
CO2: 26 mmol/L (ref 22–32)
Calcium: 9.4 mg/dL (ref 8.9–10.3)
Chloride: 105 mmol/L (ref 98–111)
Creatinine, Ser: 0.82 mg/dL (ref 0.61–1.24)
GFR, Estimated: >60 mL/min (ref >60)
Glucose, Bld: 89 mg/dL (ref 70–99)
Potassium: 3.7 mmol/L (ref 3.5–5.1)
Sodium: 139 mmol/L (ref 135–145)

## 2022-11-16 NOTE — ED Provider Notes (Signed)
[facility] DEPT Provider Note   CSN: [id number] Arrival date & time: 11/16/22  1801     History {Add pertinent medical, surgical, social history, OB history to HPI:1} Chief Complaint  Patient presents with   Dizziness    Marvin [name] is a 46 y.o. male.  HPI     This is a 46 year old male who presents with chest discomfort and dizziness.  Patient reports earlier today he noted some "light" chest discomfort.  He states it was not pain.  It seems to come and go.  It was nonexertional and nothing seems to make it better or worse.  It was self-limited.  Patient states that this evening he began to feel lightheaded.  He did recently start a statin and a low-dose aspirin.  No history of heart disease, hypertension, diabetes, smoking.  Does have a history of hyperlipidemia.  No recent fevers or cough.  Currently he is asymptomatic.  Home Medications Prior to Admission medications   Medication Sig Start Date End Date Taking? Authorizing Provider  aspirin EC 81 MG tablet Take 1 tablet (81 mg total) by mouth daily. Swallow whole. 11/10/22   Biagio Borg, MD  rosuvastatin (CRESTOR) 20 MG tablet Take 1 tablet (20 mg total) by mouth daily. 11/10/22   Biagio Borg, MD  triamcinolone cream (KENALOG) 0.5 % Apply 1 Application topically 3 (three) times daily. 10/21/22   Biagio Borg, MD      Allergies    Patient has no known allergies.    Review of Systems   Review of Systems  Constitutional:  Negative for fever.  Respiratory:  Negative for cough.   Cardiovascular:  Positive for chest pain. Negative for leg swelling.  All other systems reviewed and are negative.   Physical Exam Updated Vital Signs BP 139/85 (BP Location: Right Arm)   Pulse 76   Temp 99 F (37.2 C) (Oral)   Resp 17   Ht 1.829 m (6')   Wt 72.6 kg   SpO2 100%   BMI 21.70 kg/m  Physical Exam Vitals and nursing note reviewed.  Constitutional:      Appearance: He is well-developed.  He is not ill-appearing.  HENT:     Head: Normocephalic and atraumatic.  Eyes:     Pupils: Pupils are equal, round, and reactive to light.  Cardiovascular:     Rate and Rhythm: Normal rate and regular rhythm.     Heart sounds: Normal heart sounds. No murmur heard. Pulmonary:     Effort: Pulmonary effort is normal. No respiratory distress.     Breath sounds: Normal breath sounds. No wheezing.  Abdominal:     General: Bowel sounds are normal.     Palpations: Abdomen is soft.     Tenderness: There is no abdominal tenderness. There is no rebound.  Musculoskeletal:     Cervical back: Neck supple.  Lymphadenopathy:     Cervical: No cervical adenopathy.  Skin:    General: Skin is warm and dry.  Neurological:     Mental Status: He is alert and oriented to person, place, and time.  Psychiatric:        Mood and Affect: Mood normal.     ED Results / Procedures / Treatments   Labs (all labs ordered are listed, but only abnormal results are displayed) Labs Reviewed  BASIC METABOLIC PANEL  CBC  TROPONIN I (HIGH SENSITIVITY)  TROPONIN I (HIGH SENSITIVITY)    EKG EKG Interpretation  Date/Time:  Saturday November 16 2022 20:56:56 EST Ventricular Rate:  60 PR Interval:  136 QRS Duration: 102 QT Interval:  404 QTC Calculation: 404 R Axis:   69 Text Interpretation: Sinus rhythm RSR' in V1 or V2, right VCD or RVH ST elev, probable normal early repol pattern NO prior for comparison Confirmed by Thayer Jew [phone]) on 11/16/2022 11:04:23 PM  Radiology DG Chest 2 View  Result Date: 11/16/2022 CLINICAL DATA:  Chest pain EXAM: CHEST - 2 VIEW COMPARISON:  None Available. FINDINGS: The heart size and mediastinal contours are within normal limits. No focal pulmonary opacity. No pleural effusion or pneumothorax. The visualized upper abdomen is unremarkable. No acute osseous abnormality. IMPRESSION: No acute cardiopulmonary abnormality. Electronically Signed   By: Beryle Flock M.D.    On: 11/16/2022 20:42    Procedures Procedures  {Document cardiac monitor, telemetry assessment procedure when appropriate:1}  Medications Ordered in ED Medications - No data to display  ED Course/ Medical Decision Making/ A&P                           Medical Decision Making  ***  {Document critical care time when appropriate:1} {Document review of labs and clinical decision tools ie heart score, Chads2Vasc2 etc:1}  {Document your independent review of radiology images, and any outside records:1} {Document your discussion with family members, caretakers, and with consultants:1} {Document social determinants of health affecting pt's care:1} {Document your decision making why or why not admission, treatments were needed:1} Final Clinical Impression(s) / ED Diagnoses Final diagnoses:  None    Rx / DC Orders ED Discharge Orders     None

## 2022-11-16 NOTE — ED Provider Triage Note (Signed)
Emergency Medicine Provider Triage Evaluation Note  Marvin [name] , a 46 y.o. male  was evaluated in triage.  Pt complains of chest discomfort and lightheadedness starting around 5:30pm. Recently started on statin and daily low dose aspirin. Had some similar discomfort the other day that resolved.   Review of Systems  Positive: Cp, lightheadedness Negative: Syncope, SOB, leg pain or swelling  Physical Exam  BP 139/85 (BP Location: Right Arm)   Pulse 76   Temp 99 F (37.2 C) (Oral)   Resp 17   Ht 6' (1.829 m)   Wt 72.6 kg   SpO2 100%   BMI 21.70 kg/m  Gen:   Awake, no distress   Resp:  Normal effort  MSK:   Moves extremities without difficulty  Other:    Medical Decision Making  Medically screening exam initiated at 8:07 PM.  Appropriate orders placed.  Tarek Cravens was informed that the remainder of the evaluation will be completed by another provider, this initial triage assessment does not replace that evaluation, and the importance of remaining in the ED until their evaluation is complete.  Workup initiated   Kateri Plummer, Hershal Coria 11/16/22 2008

## 2022-11-16 NOTE — ED Triage Notes (Signed)
Patient states that he started feeling dizzy today with a slight tightness in his chest

## 2022-11-17 LAB — TROPONIN I (HIGH SENSITIVITY): Troponin I (High Sensitivity): 5 ng/L (ref <18)

## 2022-11-17 NOTE — Discharge Instructions (Signed)
You were seen today for chest pain and lightheadedness.  Your workup today is reassuring.  Follow-up with cardiology.  You have been referred.  You likely need some definitive stress testing.  If symptoms recur or worsen, you should be reevaluated.

## 2022-11-19 ENCOUNTER — Encounter: Payer: Self-pay | Admitting: Internal Medicine

## 2022-11-20 MED ORDER — ATORVASTATIN CALCIUM 20 MG PO TABS: 20.0000 mg | ORAL_TABLET | Freq: Every day | ORAL | 3 refills | Status: DC

## 2022-12-10 ENCOUNTER — Ambulatory Visit: Payer: BC Managed Care – PPO | Attending: Cardiology | Admitting: Cardiology

## 2022-12-10 ENCOUNTER — Encounter: Payer: Self-pay | Admitting: Cardiology

## 2022-12-10 VITALS — BP 100/70 | HR 47 | Ht 72.0 in | Wt 163.0 lb

## 2022-12-10 DIAGNOSIS — E785 Hyperlipidemia, unspecified: Secondary | ICD-10-CM

## 2022-12-10 DIAGNOSIS — I2510 Atherosclerotic heart disease of native coronary artery without angina pectoris: Secondary | ICD-10-CM | POA: Insufficient documentation

## 2022-12-10 DIAGNOSIS — R42 Dizziness and giddiness: Secondary | ICD-10-CM

## 2022-12-10 DIAGNOSIS — R931 Abnormal findings on diagnostic imaging of heart and coronary circulation: Secondary | ICD-10-CM | POA: Diagnosis not present

## 2022-12-10 MED ORDER — ROSUVASTATIN CALCIUM 20 MG PO TABS: 20.0000 mg | ORAL_TABLET | Freq: Every day | ORAL | 3 refills | Status: DC

## 2022-12-10 NOTE — Patient Instructions (Signed)
Medication Instructions:   Start taking Rosuvastatin  tomorrow -  for the first month take 1/2 tablet daily at bedtime.   After the one month if symptoms are stable then increase to 20 mg at bedtime.  , if you cn not tolerate the 20 mg  dose then reduce to 10 mg  again ( 1/2 tablet)   *If you need a refill on your cardiac medications before your next appointment, please call your pharmacy*   Lab Work:  Lipid, CMP in May 2024  you may do them at Dr Jenny Reichmann office.    If you have labs (blood work) drawn today and your tests are completely normal, you will receive your results only by: [place] (if you have MyChart) OR A paper copy in the mail If you have any lab test that is abnormal or we need to change your treatment, we will call you to review the results.   Testing/Procedures: Not needed   Follow-Up: At [facility], you and your health needs are our priority.  As part of our continuing mission to provide you with exceptional heart care, we have created designated Provider Care Teams.  These Care Teams include your primary Cardiologist (physician) and Advanced Practice Providers (APPs -  Physician Assistants and Nurse Practitioners) who all work together to provide you with the care you need, when you need it.     Your next appointment:   As needed     The format for your next appointment:   In Person  Provider:   Glenetta Hew, MD   If Dr Jenny Reichmann  would like for you to follow up with Dr Ellyn Hack  -just call to make an appointment Other Instructions

## 2022-12-10 NOTE — Progress Notes (Signed)
Primary Care Provider: Biagio Borg, MD [place] Cardiologist: Glenetta Hew, MD Electrophysiologist: None  Clinic Note: Chief Complaint  Patient presents with   New Patient (Initial Visit)    Evaluate dizziness with?  Chest discomfort-ER follow-up.   ===================================  ASSESSMENT/PLAN   Problem List Items Addressed This Visit       Cardiology Problems   Hyperlipidemia LDL goal <100 (Chronic)    With chronic score of 156 DES at least less than 100 and preferably less than 70.  Was started on statin but seems to be having strange symptoms.--We will have a rechallenge attempt as follows. Restart rosuvastatin  tomorrow -  for the first month take 1/2 tablet daily at bedtime.   After the one month if symptoms are stable then increase to 20 mg at bedtime.  , if you cn not tolerate the 20 mg  dose then reduce to 10 mg  again ( 1/2 tablet)      Relevant Medications   rosuvastatin (CRESTOR) 20 MG tablet   Other Relevant Orders   Lipid panel   Comprehensive metabolic panel   Agatston coronary artery calcium score between 100 and 199 - Primary (Chronic)    Coronary Calcium Score 156 all involving the LAD.  Clearly good indication to treat lipids.  Unfortunately he did not tolerate rosuvastatin very well.  I reviewed with him the atherosclerosis process pathophysiology and how Coronary Calcium Score correlates with that.  I told him that it is indeed important to get his lipids under control but now with LDL goal less than 70. Thankfully, his blood pressure is pretty well-controlled which should be 1 month and make midodrine with.  He is already on aspirin.  He really has not had any chest pain or pressure along that we need to do any more evaluation of his anatomy, we need to figure out his symptoms that he is attributing to statin.  At this level coronary calcium the use of aspirin is of benefit.  For now we will leave low-dose rosuvastatin to see if  he tolerates it, NSR just continue on lower dose, otherwise convert to atorvastatin.      Relevant Medications   rosuvastatin (CRESTOR) 20 MG tablet   Other Relevant Orders   EKG 12-Lead (Completed)   Lipid panel   Comprehensive metabolic panel     Other   Episode of dizziness    He says that overall his symptoms are definitely improved.  His baseline blood pressure and heart rate is pretty low.  He is very active.  He also really did deny any chest pain making ischemia unlikely.  I suspect that he could just have a low blood pressure.  Recommended increased hydration.  Would avoid antihypertensives for now.       ===================================  HPI:    Marvin [name] is a 47 y.o. male who is being seen today for the evaluation of dizziness at the request of Biagio Borg, MD.  Rama Mcclintock was seen by Dr. Jenny Reichmann on October 21, 2022 for routine follow-up.  They discussed coronary calcium score, but was declining use of statin at that time.  No cardiac symptoms.. -=> After Coronary Calcium Score was reported, that he was started on Crestor.  Recent Hospitalizations:  [facility], ER 12/16-17/2023: Presented with chest pain and dizziness like chest discomfort.  Not exertional, self-limiting.  Also noted some lightheadedness.  Recently started on rosuvastatin and aspirin. => Was referred to cardiology.   Reviewed  CV studies:    The following studies were reviewed today: (if available, images/films reviewed: From Epic Chart or Care Everywhere) Coronary Calcium Score 11/08/2022: Total score 157-all LAD.:  Interval History:   Zymarion Favorite presents here today actually referred by ER physician for lightheadedness and dizziness.  He tells me that he never had chest discomfort.  He felt dizzy and weak and then later his heart was racing and beating somewhat irregularly off and on.  Nothing.  This all started a week or so after he started Crestor.  He said that over the few weeks in  question, he was just feeling very jittery and dizzy and lightheaded.  Somewhat weak and fatigued.  The symptoms are quite unusual for him.  But he does note that his the day he went to the ER he probably had not been eating and drinking as well as he normally would.  He drinks maybe 60 ounces of water a day.  He is quite active at baseline he runs several miles A couple days a week and does yoga and other strength and conditioning exercises.  He has never had any chest pain or pressure with rest or exertion.  These unusual symptoms all been since starting Crestor according to his recollection.  He has not had syncope or near syncope just the dizziness and feeling of weakness. He declines having had chest pain or pressure with rest or exertion.  No PND, orthopnea or edema. He also describes having couple skipped beats here and there but nothing prolonged.  He has recently stopped taking rosuvastatin 20 mg --he was just prescribed atorvastatin as another option.  REVIEWED OF SYSTEMS   Review of Systems  Constitutional:  Negative for malaise/fatigue (Not really fatigue, just has felt weak and jittery) and weight loss.  Respiratory:  Negative for cough and shortness of breath.   Gastrointestinal:  Negative for blood in stool, constipation and melena.  Genitourinary:  Negative for hematuria.  Musculoskeletal:  Positive for joint pain (Mild aches and pains but nothing significant). Negative for falls and myalgias.  Neurological:  Positive for dizziness.  Psychiatric/Behavioral:  Negative for depression and memory loss. The patient is nervous/anxious. The patient does not have insomnia.    I have reviewed and (if needed) personally updated the patient's problem list, medications, allergies, past medical and surgical history, social and family history.   PAST MEDICAL HISTORY   Past Medical History:  Diagnosis Date   Chronic kidney disease    Cyst and kidney stone   Other and unspecified  hyperlipidemia 01/18/2013    PAST SURGICAL HISTORY   Past Surgical History:  Procedure Laterality Date   APPENDECTOMY  2001    Immunization History  Administered Date(s) Administered   Influenza-Unspecified 09/02/2015, 08/27/2017, 08/24/2018, 09/04/2020, 09/01/2021, 08/20/2022   PFIZER(Purple Top)SARS-COV-2 Vaccination 03/02/2020, 03/31/2020, 10/16/2020, 08/13/2021, 09/01/2022   Tdap 07/02/2010, 10/17/2020    MEDICATIONS/ALLERGIES   Current Meds  Medication Sig   aspirin EC 81 MG tablet Take 1 tablet (81 mg total) by mouth daily. Swallow whole.   atorvastatin (LIPITOR) 20 MG tablet Take 1 tablet (20 mg total) by mouth daily.   triamcinolone cream (KENALOG) 0.5 % Apply 1 Application topically 3 (three) times daily.    No Known Allergies  SOCIAL HISTORY/FAMILY HISTORY   Reviewed in Epic:   Social History   Tobacco Use   Smoking status: Never   Smokeless tobacco: Never  Vaping Use   Vaping Use: Never used  Substance Use Topics   Alcohol  use: Yes    Comment: occasional social   Drug use: No   Social History   Social History Narrative   Not on file   Family History  Problem Relation Age of Onset   Prostate cancer Father    Hyperlipidemia Other    Hypertension Other    Heart disease Other    Diabetes Other    Sudden death Other    Colon cancer Neg Hx    Colon polyps Neg Hx    Esophageal cancer Neg Hx    Stomach cancer Neg Hx    Rectal cancer Neg Hx     OBJCTIVE -PE, EKG, labs   Wt Readings from Last 3 Encounters:  12/10/22 163 lb (73.9 kg)  11/16/22 160 lb (72.6 kg)  10/21/22 164 lb (74.4 kg)    Physical Exam: BP 100/70   Pulse (!) 47   Ht 6' (1.829 m)   Wt 163 lb (73.9 kg)   SpO2 98%   BMI 22.11 kg/m  Physical Exam Vitals reviewed.  Constitutional:      General: He is not in acute distress.    Appearance: Normal appearance. He is normal weight.  HENT:     Head: Normocephalic and atraumatic.  Eyes:     Extraocular Movements:  Extraocular movements intact.     Conjunctiva/sclera: Conjunctivae normal.     Pupils: Pupils are equal, round, and reactive to light.  Cardiovascular:     Rate and Rhythm: Normal rate and regular rhythm.     Pulses: Normal pulses.     Heart sounds: Normal heart sounds. No murmur heard.    No friction rub. No gallop.  Pulmonary:     Effort: Pulmonary effort is normal. No respiratory distress.     Breath sounds: Normal breath sounds. No wheezing, rhonchi or rales.  Chest:     Chest wall: No tenderness.  Abdominal:     General: Abdomen is flat. Bowel sounds are normal. There is no distension.     Palpations: There is no mass.     Tenderness: There is no abdominal tenderness.     Hernia: No hernia is present.     Comments: No HSM or bruit  Musculoskeletal:        General: No swelling. Normal range of motion.  Skin:    General: Skin is warm and dry.  Neurological:     General: No focal deficit present.     Mental Status: He is alert and oriented to person, place, and time. Mental status is at baseline.  Psychiatric:        Mood and Affect: Mood normal.        Behavior: Behavior normal.        Thought Content: Thought content normal.        Judgment: Judgment normal.     Adult ECG Report  Rate: 47 ;  Rhythm: sinus bradycardia and otherwise normal axis, intervals durations. ;   Narrative Interpretation: Normal except for the bradycardia.  Recent Labs: Reviewed Lab Results  Component Value Date   CHOL 217 (H) 10/21/2022   HDL 58.20 10/21/2022   LDLCALC 138 (H) 10/21/2022   LDLDIRECT 114.1 01/18/2013   TRIG 107.0 10/21/2022   CHOLHDL 4 10/21/2022   Lab Results  Component Value Date   CREATININE 0.82 11/16/2022   BUN 15 11/16/2022   NA 139 11/16/2022   K 3.7 11/16/2022   CL 105 11/16/2022   CO2 26 11/16/2022      Latest Ref  Rng & Units 11/16/2022    8:50 PM 10/21/2022    1:52 PM 10/23/2021    1:55 PM  CBC  WBC 4.0 - 10.5 K/uL 7.0  8.0  9.4   Hemoglobin 13.0 -  17.0 g/dL 16.7  16.8  16.1   Hematocrit 39.0 - 52.0 % 47.0  47.6  46.9   Platelets 150 - 400 K/uL 202  214.0  184.0     No results found for: "HGBA1C" Lab Results  Component Value Date   TSH 0.99 10/21/2022    ================================================== I spent a total of 32 minutes with the patient spent in direct patient consultation.  Additional time spent with chart review  / charting (studies, outside notes, etc): 13 min Total Time: 45 min  Current medicines are reviewed at length with the patient today.  (+/- concerns) n/a  Notice: This dictation was prepared with Dragon dictation along with smart phrase technology. Any transcriptional errors that result from this process are unintentional and may not be corrected upon review.   Studies Ordered:  Orders Placed This Encounter  Procedures   Lipid panel   Comprehensive metabolic panel   EKG 88-CZYS   Meds ordered this encounter  Medications   rosuvastatin (CRESTOR) 20 MG tablet    Sig: Take 1 tablet (20 mg total) by mouth daily. Follow direction given at office visit    Dispense:  90 tablet    Refill:  3    Patient Instructions / Medication Changes & Studies & Tests Ordered   Patient Instructions  Medication Instructions:   Start taking Rosuvastatin  tomorrow -  for the first month take 1/2 tablet daily at bedtime.   After the one month if symptoms are stable then increase to 20 mg at bedtime.  , if you cn not tolerate the 20 mg  dose then reduce to 10 mg  again ( 1/2 tablet)   *If you need a refill on your cardiac medications before your next appointment, please call your pharmacy*   Lab Work:  Lipid, CMP in May 2024  you may do them at Dr Jenny Reichmann office.    If you have labs (blood work) drawn today and your tests are completely normal, you will receive your results only by: [facility] (if you have MyChart) OR A paper copy in the mail If you have any lab test that is abnormal or we need to change your  treatment, we will call you to review the results.   Testing/Procedures: Not needed   Follow-Up: At [facility], you and your health needs are our priority.  As part of our continuing mission to provide you with exceptional heart care, we have created designated Provider Care Teams.  These Care Teams include your primary Cardiologist (physician) and Advanced Practice Providers (APPs -  Physician Assistants and Nurse Practitioners) who all work together to provide you with the care you need, when you need it.     Your next appointment:   As needed     The format for your next appointment:   In Person  Provider:   Glenetta Hew, MD   If Dr Jenny Reichmann  would like for you to follow up with Dr Ellyn Hack  -just call to make an appointment Other Instructions     Leonie Man, MD, MS Glenetta Hew, M.D., M.S. Interventional Cardiologist  [place]  Pager # [phone] Phone # [phone] [address] [place], [place] [address]   Thank you for choosing [facility]  IT consultant at [employer]!!

## 2022-12-14 ENCOUNTER — Encounter: Payer: Self-pay | Admitting: Cardiology

## 2022-12-14 DIAGNOSIS — R42 Dizziness and giddiness: Secondary | ICD-10-CM | POA: Insufficient documentation

## 2022-12-14 NOTE — Assessment & Plan Note (Signed)
With chronic score of 156 DES at least less than 100 and preferably less than 70.  Was started on statin but seems to be having strange symptoms.--We will have a rechallenge attempt as follows. Restart rosuvastatin  tomorrow -  for the first month take 1/2 tablet daily at bedtime.   After the one month if symptoms are stable then increase to 20 mg at bedtime.  , if you cn not tolerate the 20 mg  dose then reduce to 10 mg  again ( 1/2 tablet)

## 2022-12-14 NOTE — Assessment & Plan Note (Signed)
Coronary Calcium Score 156 all involving the LAD.  Clearly good indication to treat lipids.  Unfortunately he did not tolerate rosuvastatin very well.  I reviewed with him the atherosclerosis process pathophysiology and how Coronary Calcium Score correlates with that.  I told him that it is indeed important to get his lipids under control but now with LDL goal less than 70. Thankfully, his blood pressure is pretty well-controlled which should be 1 month and make midodrine with.  He is already on aspirin.  He really has not had any chest pain or pressure along that we need to do any more evaluation of his anatomy, we need to figure out his symptoms that he is attributing to statin.  At this level coronary calcium the use of aspirin is of benefit.  For now we will leave low-dose rosuvastatin to see if he tolerates it, NSR just continue on lower dose, otherwise convert to atorvastatin.

## 2022-12-14 NOTE — Assessment & Plan Note (Signed)
He says that overall his symptoms are definitely improved.  His baseline blood pressure and heart rate is pretty low.  He is very active.  He also really did deny any chest pain making ischemia unlikely.  I suspect that he could just have a low blood pressure.  Recommended increased hydration.  Would avoid antihypertensives for now.

## 2022-12-26 DIAGNOSIS — N401 Enlarged prostate with lower urinary tract symptoms: Secondary | ICD-10-CM | POA: Diagnosis not present

## 2022-12-26 DIAGNOSIS — R3912 Poor urinary stream: Secondary | ICD-10-CM | POA: Diagnosis not present

## 2023-02-16 ENCOUNTER — Encounter: Payer: Self-pay | Admitting: Internal Medicine

## 2023-04-11 ENCOUNTER — Other Ambulatory Visit (INDEPENDENT_AMBULATORY_CARE_PROVIDER_SITE_OTHER): Payer: BC Managed Care – PPO

## 2023-04-11 DIAGNOSIS — E7800 Pure hypercholesterolemia, unspecified: Secondary | ICD-10-CM

## 2023-04-11 LAB — LIPID PANEL
Cholesterol: 126 mg/dL (ref 0–200)
HDL: 57.1 mg/dL (ref >39.00)
LDL Cholesterol: 52 mg/dL (ref 0–99)
NonHDL: 69.28
Total CHOL/HDL Ratio: 2
Triglycerides: 85 mg/dL (ref 0.0–149.0)
VLDL: 17 mg/dL (ref 0.0–40.0)

## 2023-04-11 NOTE — Progress Notes (Signed)
The test results show that your current treatment is OK, as the tests are stable and the LDL cholesterol is MUCH improved. .  Please continue the same plan.  There is no other need for change of treatment or further evaluation based on these results, at this time.  thanks

## 2023-07-22 ENCOUNTER — Telehealth: Payer: Self-pay | Admitting: Cardiology

## 2023-07-22 ENCOUNTER — Encounter: Payer: Self-pay | Admitting: Cardiology

## 2023-07-22 NOTE — Telephone Encounter (Signed)
Appointment made for tomorrow with PA. No current chest pain.

## 2023-07-22 NOTE — Telephone Encounter (Signed)
See some of same issues at his January appointment.  Was decided due to a medication.  He has been fine until recently and states "it is different"

## 2023-07-22 NOTE — Telephone Encounter (Signed)
Pt c/o of Chest Pain: STAT if active CP, including tightness, pressure, jaw pain, radiating pain to shoulder/upper arm/back, CP unrelieved by Nitro. Symptoms reported of SOB, nausea, vomiting, sweating.  1. Are you having CP right now? No chest pain now.    2. Are you experiencing any other symptoms (ex. SOB, nausea, vomiting, sweating)? I felt a little lightheaded yesterday morning, and again this morning. If I take a deep breath there is a little tightness in my chest.   3. Is your CP continuous or coming and going? No pain since Sunday afternoon.   4. Have you taken Nitroglycerin? No nitroglycerin. I have been taking a baby aspirin.   5. How long have you been experiencing CP? Mild pain/discomfort Sunday afternoon but not since then.    Answers received via mychart patient schedule

## 2023-07-22 NOTE — Telephone Encounter (Signed)
Error

## 2023-07-23 ENCOUNTER — Ambulatory Visit: Payer: BC Managed Care – PPO | Attending: Physician Assistant | Admitting: Physician Assistant

## 2023-07-23 ENCOUNTER — Encounter: Payer: Self-pay | Admitting: Physician Assistant

## 2023-07-23 VITALS — BP 114/82 | HR 54 | Ht 71.0 in | Wt 162.0 lb

## 2023-07-23 DIAGNOSIS — E785 Hyperlipidemia, unspecified: Secondary | ICD-10-CM

## 2023-07-23 DIAGNOSIS — R42 Dizziness and giddiness: Secondary | ICD-10-CM | POA: Diagnosis not present

## 2023-07-23 DIAGNOSIS — R072 Precordial pain: Secondary | ICD-10-CM | POA: Diagnosis not present

## 2023-07-23 DIAGNOSIS — R079 Chest pain, unspecified: Secondary | ICD-10-CM | POA: Diagnosis not present

## 2023-07-23 NOTE — Progress Notes (Signed)
Cardiology Office Note:  .   Date:  07/23/2023  ID:  Marvin [name], DOB Apr 16, 1976, MRN [id number] PCP: Corwin Levins, MD  [facility] Providers Cardiologist:  Bryan Lemma, MD     History of Present Illness: .   Marvin [name] is a 47 y.o. male with PMH of elevated coronary calcium score and hyperlipidemia.  Coronary calcium score obtained on 11/08/2022 was 157, all the coronary calcium was found in the LAD territory, this placed the patient at 96 percentile for age and sex matched control.  He was seen in the ED on 11/16/2022 for atypical chest pain.  However when he was evaluated by Dr. Herbie Baltimore in January 2024, he says he never really had chest discomfort, he felt dizzy and weak and felt his heart racing and beating irregularly a week or so after he started Crestor.  He has been very active.  Crestor was increased to 20 mg daily with LDL goal of less than 70.  Last lipid test obtained in May 2024 showed very well-controlled cholesterol.  He is training for marathon and was running for a total of 4 hours on Sunday.  He felt good during the run.  He says this run was longer than his usual run.  Sunday afternoon, while he was resting, he started having substernal chest discomfort.  There was no radiation.  He denies any palpitation.  The chest discomfort has been intermittent since then.  It would last several hours at a time and he says it may be slightly worse when he pay attention to it and when he take a deep breath.  He also has been having occasional dizzy spell as well.  He has a prior history of dizziness and passing out during blood draw.  I checked a manual repeat blood pressure in the clinic, my manual repeat was 114/82.  I recommend continue observing the dizzy spell for the time being.  As far as his chest discomfort, I discussed the case with his cardiologist Dr. Herbie Baltimore, we will proceed with coronary CT.  If coronary CT is negative, that he can follow-up with cardiology service as  needed.  If he has mild to moderate disease, we will follow-up with him in 1 year.  If he has severe disease, we will contact him to bring him back earlier to discuss the result.  ROS:   He denies chest pain, palpitations, dyspnea, pnd, orthopnea, n, v, dizziness, syncope, edema, weight gain, or early satiety. All other systems reviewed and are otherwise negative except as noted above.    Studies Reviewed: Marland Kitchen   EKG Interpretation Date/Time:  Wednesday July 23 2023 08:59:01 EDT Ventricular Rate:  54 PR Interval:  128 QRS Duration:  100 QT Interval:  412 QTC Calculation: 390 R Axis:   68  Text Interpretation: Sinus bradycardia  No significant ST-T wave changes Confirmed by Azalee Course [id number]) on 07/23/2023 9:14:14 AM    Cardiac Studies & Procedures          CT SCANS  CT CARDIAC SCORING (SELF PAY ONLY) 11/08/2022  Addendum 11/08/2022  9:22 PM ADDENDUM REPORT: 11/08/2022 21:20  CLINICAL DATA:  Cardiovascular Disease Risk stratification  EXAM: Coronary Calcium Score  TECHNIQUE: A gated, non-contrast computed tomography scan of the heart was performed using 3mm slice thickness. Axial images were analyzed on a dedicated workstation. Calcium scoring of the coronary arteries was performed using the Agatston method.  FINDINGS: Coronary arteries: Normal origins.  Coronary Calcium Score:  Left main:  Left  anterior descending artery: 157  Left circumflex artery:  Right coronary artery:  Total: 157  Percentile: 96th  Pericardium: Normal.  Ascending Aorta: Normal caliber.  Non-cardiac: See separate report from [facility].  IMPRESSION: Coronary calcium score of 157. This was 96th percentile for age-, race-, and sex-matched controls.  RECOMMENDATIONS: Coronary artery calcium (CAC) score is a strong predictor of incident coronary heart disease (CHD) and provides predictive information beyond traditional risk factors. CAC scoring is reasonable to use in the  decision to withhold, postpone, or initiate statin therapy in intermediate-risk or selected borderline-risk asymptomatic adults (age 25-75 years and LDL-C >=70 to <190 mg/dL) who do not have diabetes or established atherosclerotic cardiovascular disease (ASCVD).* In intermediate-risk (10-year ASCVD risk >=7.5% to <20%) adults or selected borderline-risk (10-year ASCVD risk >=5% to <7.5%) adults in whom a CAC score is measured for the purpose of making a treatment decision the following recommendations have been made:  If CAC=0, it is reasonable to withhold statin therapy and reassess in 5 to 10 years, as long as higher risk conditions are absent (diabetes mellitus, family history of premature CHD in first degree relatives (males <55 years; females <65 years), cigarette smoking, or LDL >=190 mg/dL).  If CAC is 1 to 99, it is reasonable to initiate statin therapy for patients >=49 years of age.  If CAC is >=100 or >=75th percentile, it is reasonable to initiate statin therapy at any age.  Cardiology referral should be considered for patients with CAC scores >=400 or >=75th percentile.  *2018 AHA/ACC/AACVPR/AAPA/ABC/ACPM/ADA/AGS/APhA/ASPC/NLA/PCNA Guideline on the Management of Blood Cholesterol: A Report of the American College of Cardiology/American Heart Association Task Force on Clinical Practice Guidelines. J Am Coll Cardiol. 2019;73(24):3168-3209.  Epifanio Lesches, MD   Electronically Signed By: Epifanio Lesches M.D. On: 11/08/2022 21:20  Narrative EXAM: OVER-READ INTERPRETATION  CT CHEST  The following report is a limited chest CT over-read performed by radiologist Dr. Genevive Bi of [facility] on 11/08/2022. This over-read does not include interpretation of cardiac or coronary anatomy or pathology. The calcium score interpretation by the cardiologist is attached.  COMPARISON:  None Available.  FINDINGS: Limited view of the lung  parenchyma demonstrates no suspicious nodularity. Airways are normal.  Limited view of the mediastinum demonstrates no adenopathy. Esophagus normal.  Limited view of the upper abdomen unremarkable.  Limited view of the skeleton and chest wall is unremarkable.  IMPRESSION: No significant extracardiac findings.  Electronically Signed: By: Genevive Bi M.D. On: 11/08/2022 15:04          Risk Assessment/Calculations:             Physical Exam:   VS:  BP 114/82   Pulse (!) 54   Ht 5\' 11"  (1.803 m)   Wt 162 lb (73.5 kg)   SpO2 99%   BMI 22.59 kg/m    Wt Readings from Last 3 Encounters:  07/23/23 162 lb (73.5 kg)  12/10/22 163 lb (73.9 kg)  11/16/22 160 lb (72.6 kg)    GEN: Well nourished, well developed in no acute distress NECK: No JVD; No carotid bruits CARDIAC: RRR, no murmurs, rubs, gallops RESPIRATORY:  Clear to auscultation without rales, wheezing or rhonchi  ABDOMEN: Soft, non-tender, non-distended EXTREMITIES:  No edema; No deformity   ASSESSMENT AND PLAN: .    Chest pain: Symptom has been intermittent for the past 3 days and may last hours at a time.  He went for 4 hours on Sunday without any exertional chest pain.  Chest pain started after he finished running.  He has a history of elevated coronary calcium score, I will proceed with coronary CT  Dizziness: He has a history of dizziness and passing out with blood draw.  He has been having some dizziness lately, manual repeat blood pressure myself was 114/82.  He is keeping himself adequately hydrated.  There is no evidence of advanced heart block on EKG.  Will continue to monitor at this time  Hyperlipidemia: On Crestor, recent blood work obtained in May 2024 showed very well-controlled cholesterol.        Dispo: as needed if coronary CT is negative, 1 year if coronary CT shows mild to moderate CAD, sooner if coronary CT shows severe disease  Signed, Azalee Course, PA

## 2023-07-23 NOTE — Patient Instructions (Signed)
Medication Instructions:  Your physician recommends that you continue on your current medications as directed. Please refer to the Current Medication list given to you today.  *If you need a refill on your cardiac medications before your next appointment, please call your pharmacy*   Lab Work: BMET today   Testing/Procedures:   Your cardiac CT will be scheduled at one of the below locations:   [facility] [address] [place], [place] [address] [phone]  OR  [facility] [address] [place], [place] [address] [phone]  OR   [facility] [address] [place], [place] [address] [phone]  If scheduled at [facility], please arrive at the [facility] and Children's Entrance (Entrance C2) of [facility] 30 minutes prior to test start time. You can use the FREE valet parking offered at entrance C (encouraged to control the heart rate for the test)  Proceed to the [facility] Radiology Department (first floor) to check-in and test prep.  All radiology patients and guests should use entrance C2 at [facility], accessed from [facility], even though the hospital's physical address listed is [address]    If scheduled at [facility] or [facility], please arrive 15 mins early for check-in and test prep.  There is spacious parking and easy access to the radiology department from the [facility] Heart and Vascular entrance. Please enter here and check-in with the desk attendant.   Please follow these instructions carefully (unless otherwise directed):  An IV will be required for this test and Nitroglycerin will be given.  Hold all erectile dysfunction medications at least 3 days (72 hrs) prior to test. (Ie viagra, cialis, sildenafil, tadalafil, etc)   On the Night Before the Test: Be sure to  Drink plenty of water. Do not consume any caffeinated/decaffeinated beverages or chocolate 12 hours prior to your test. Do not take any antihistamines 12 hours prior to your test. If the patient has contrast allergy: Patient will need a prescription for Prednisone and very clear instructions (as follows): Prednisone 50 mg - take 13 hours prior to test Take another Prednisone 50 mg 7 hours prior to test Take another Prednisone 50 mg 1 hour prior to test Take Benadryl 50 mg 1 hour prior to test Patient must complete all four doses of above prophylactic medications. Patient will need a ride after test due to Benadryl.  On the Day of the Test: Drink plenty of water until 1 hour prior to the test. Do not eat any food 1 hour prior to test. You may take your regular medications prior to the test.  Take metoprolol (Lopressor) two hours prior to test. If you take Furosemide/Hydrochlorothiazide/Spironolactone, please HOLD on the morning of the test. FEMALES- please wear underwire-free bra if available, avoid dresses & tight clothing  *For Clinical Staff only. Please instruct patient the following:* Heart Rate Medication Recommendations for Cardiac CT  Resting HR < 50 bpm  No medication  Resting HR 50-60 bpm and BP >110/50 mmHG   Consider Metoprolol tartrate 25 mg PO 90-120 min prior to scan  Resting HR 60-65 bpm and BP >110/50 mmHG  Metoprolol tartrate 50 mg PO 90-120 minutes prior to scan   Resting HR > 65 bpm and BP >110/50 mmHG  Metoprolol tartrate 100 mg PO 90-120 minutes prior to scan  Consider Ivabradine 10-15 mg PO or a calcium channel blocker for  resting HR >60 bpm and contraindication to metoprolol tartrate  Consider Ivabradine 10-15 mg PO in combination with metoprolol tartrate for HR >80 bpm        After the Test: Drink plenty of water. After receiving IV contrast, you may experience a mild flushed feeling. This is normal. On occasion, you may experience a mild rash up to 24  hours after the test. This is not dangerous. If this occurs, you can take Benadryl 25 mg and increase your fluid intake. If you experience trouble breathing, this can be serious. If it is severe call 911 IMMEDIATELY. If it is mild, please call our office. If you take any of these medications: Glipizide/Metformin, Avandament, Glucavance, please do not take 48 hours after completing test unless otherwise instructed.  We will call to schedule your test 2-4 weeks out understanding that some insurance companies will need an authorization prior to the service being performed.   For more information and frequently asked questions, please visit our website : [email or web address]  For non-scheduling related questions, please contact the cardiac imaging nurse navigator should you have any questions/concerns: Cardiac Imaging Nurse Navigators Direct Office Dial: [phone]   For scheduling needs, including cancellations and rescheduling, please call [place], [phone].   Follow-Up: At [facility], you and your health needs are our priority.  As part of our continuing mission to provide you with exceptional heart care, we have created designated Provider Care Teams.  These Care Teams include your primary Cardiologist (physician) and Advanced Practice Providers (APPs -  Physician Assistants and Nurse Practitioners) who all work together to provide you with the care you need, when you need it.  We recommend signing up for the patient portal called "MyChart".  Sign up information is provided on this After Visit Summary.  MyChart is used to connect with patients for Virtual Visits (Telemedicine).  Patients are able to view lab/test results, encounter notes, upcoming appointments, etc.  Non-urgent messages can be sent to your provider as well.   To learn more about what you can do with MyChart, go to [email or web address].    Your next appointment:    Follow up as  needed  Provider:   Bryan Lemma, MD     Other Instructions Continue to monitor symptoms

## 2023-07-24 LAB — BASIC METABOLIC PANEL
BUN/Creatinine Ratio: 15 (ref 9–20)
BUN: 12 mg/dL (ref 6–24)
CO2: 25 mmol/L (ref 20–29)
Calcium: 9.9 mg/dL (ref 8.7–10.2)
Chloride: 101 mmol/L (ref 96–106)
Creatinine, Ser: 0.8 mg/dL (ref 0.76–1.27)
Glucose: 75 mg/dL (ref 70–99)
Potassium: 4.6 mmol/L (ref 3.5–5.2)
Sodium: 140 mmol/L (ref 134–144)
eGFR: 110 mL/min/{1.73_m2} (ref >59)

## 2023-07-25 ENCOUNTER — Telehealth (HOSPITAL_COMMUNITY): Payer: Self-pay | Admitting: *Deleted

## 2023-07-25 NOTE — Telephone Encounter (Addendum)
Patient calling to scheduled his cardiac CT scan.  Appointment made for Thursday, August 29 at 8am. He denies a contrast allergy and is aware to arrive at 7:30 AM. He verified that he has possession of cardiac CT instructions and is aware to call back if there are any questions.  Larey Brick RN Navigator Cardiac Imaging [facility] Heart and Vascular Services [phone] Office [phone] Cell

## 2023-07-31 ENCOUNTER — Ambulatory Visit (HOSPITAL_COMMUNITY)
Admission: RE | Admit: 2023-07-31 | Discharge: 2023-07-31 | Disposition: A | Discharge status: Home / Self Care | Payer: BC Managed Care – PPO | Source: Ambulatory Visit | Attending: Physician Assistant | Admitting: Physician Assistant

## 2023-07-31 DIAGNOSIS — R072 Precordial pain: Secondary | ICD-10-CM

## 2023-07-31 MED ORDER — NITROGLYCERIN 0.4 MG SL SUBL
0.8000 mg | SUBLINGUAL_TABLET | Freq: Once | SUBLINGUAL | Status: AC
  Administered 2023-07-31: 0.8 mg via SUBLINGUAL

## 2023-07-31 MED ORDER — NITROGLYCERIN 0.4 MG SL SUBL
SUBLINGUAL_TABLET | SUBLINGUAL | Status: AC
  Filled 2023-07-31: qty 2

## 2023-07-31 MED ORDER — IOHEXOL 350 MG/ML SOLN
100.0000 mL | Freq: Once | INTRAVENOUS | Status: AC | PRN
  Administered 2023-07-31: 100 mL via INTRAVENOUS

## 2023-08-19 ENCOUNTER — Telehealth: Payer: Self-pay

## 2023-08-19 NOTE — Progress Notes (Unsigned)
    Marvin [name] D.[name] [address] [place] [address] Phone: [phone]   Assessment and Plan:     There are no diagnoses linked to this encounter.  ***   Pertinent previous records reviewed include ***   Follow Up: ***     Subjective:   I, Marvin [name], am serving as a Neurosurgeon for Doctor Marvin [name]  Chief Complaint: right ankle pain   HPI:   08/20/2023 Patient is a 47 year old male complaining of right ankle pain. Patient states  Relevant Historical Information: ***  Additional pertinent review of systems negative.   Current Outpatient Medications:    aspirin EC 81 MG tablet, Take 1 tablet (81 mg total) by mouth daily. Swallow whole., Disp: 30 tablet, Rfl: 12   rosuvastatin (CRESTOR) 20 MG tablet, Take 1 tablet (20 mg total) by mouth daily. Follow direction given at office visit, Disp: 90 tablet, Rfl: 3   triamcinolone cream (KENALOG) 0.5 %, Apply 1 Application topically 3 (three) times daily., Disp: 30 g, Rfl: 1   Objective:     There were no vitals filed for this visit.    There is no height or weight on file to calculate BMI.    Physical Exam:    ***   Electronically signed by:  Marvin [name] D.[name] 7:03 AM 08/19/23

## 2023-08-19 NOTE — Telephone Encounter (Addendum)
Called patient regarding results, left vm for patient to call office back.  ----- Message from Azalee Course sent at 08/11/2023  8:14 AM EDT ----- Extracardiac portion read by radiologist and was normal.

## 2023-08-20 ENCOUNTER — Ambulatory Visit (INDEPENDENT_AMBULATORY_CARE_PROVIDER_SITE_OTHER): Payer: BC Managed Care – PPO | Admitting: Sports Medicine

## 2023-08-20 VITALS — BP 108/78 | HR 72 | Ht 71.0 in | Wt 164.0 lb

## 2023-08-20 DIAGNOSIS — M25571 Pain in right ankle and joints of right foot: Secondary | ICD-10-CM | POA: Diagnosis not present

## 2023-08-20 MED ORDER — MELOXICAM 15 MG PO TABS: 15.0000 mg | ORAL_TABLET | Freq: Every day | ORAL | 0 refills | Status: AC

## 2023-08-20 NOTE — Patient Instructions (Signed)
-   Start meloxicam 15 mg daily x2 weeks.  If still having pain after 2 weeks, complete 3rd-week of meloxicam. May use remaining meloxicam as needed once daily for pain control.  Do not to use additional NSAIDs while taking meloxicam.  May use Tylenol [phone] mg 2 to 3 times a day for breakthrough pain. Ankle HEP  Recommend getting an additional pair of shoes and alternating while training  3 week follow up

## 2023-08-26 ENCOUNTER — Telehealth: Payer: Self-pay

## 2023-08-26 NOTE — Telephone Encounter (Addendum)
Called patient regarding results, left vm for patient to call office back. 2nd attempt.   ----- Message from Marvin [name] sent at 08/11/2023  8:14 AM EDT ----- Extracardiac portion read by radiologist and was normal.

## 2023-08-28 NOTE — Telephone Encounter (Signed)
Results previously released via MyChart and viewed by patient on 08/01/23.

## 2023-09-03 ENCOUNTER — Encounter: Payer: Self-pay | Admitting: Internal Medicine

## 2023-09-03 ENCOUNTER — Encounter: Payer: Self-pay | Admitting: Sports Medicine

## 2023-09-09 NOTE — Progress Notes (Unsigned)
    Aleen Sells D.[facility] [address] [place] [address] Phone: [phone]   Assessment and Plan:     There are no diagnoses linked to this encounter.  ***   Pertinent previous records reviewed include ***   Follow Up: ***     Subjective:   I, Marvin [name], am serving as a Neurosurgeon for Doctor Richardean Sale   Chief Complaint: right ankle pain    HPI:    08/20/2023 Patient is a 47 year old male complaining of right ankle pain. Patient states that he is training for a marathon. Labor day was doing a long run and felt pain through his achilles tendon. No radiating pain . Medial ankle pain . No MOI. No meds for the pain . He has been able to run. No antalgic gait. Can start to feel it after a few miles. No change in shoes . Does have a lot of miles on his shoes. He is wearing altras. No pain when walking in regular loafers.   09/10/2023 Patient state   Relevant Historical Information: None pertinent  Additional pertinent review of systems negative.   Current Outpatient Medications:    aspirin EC 81 MG tablet, Take 1 tablet (81 mg total) by mouth daily. Swallow whole., Disp: 30 tablet, Rfl: 12   meloxicam (MOBIC) 15 MG tablet, Take 1 tablet (15 mg total) by mouth daily., Disp: 30 tablet, Rfl: 0   rosuvastatin (CRESTOR) 20 MG tablet, Take 1 tablet (20 mg total) by mouth daily. Follow direction given at office visit, Disp: 90 tablet, Rfl: 3   triamcinolone cream (KENALOG) 0.5 %, Apply 1 Application topically 3 (three) times daily., Disp: 30 g, Rfl: 1   Objective:     There were no vitals filed for this visit.    There is no height or weight on file to calculate BMI.    Physical Exam:    ***   Electronically signed by:  Aleen Sells D.[facility] 8:31 AM 09/09/23

## 2023-09-10 ENCOUNTER — Ambulatory Visit: Payer: BC Managed Care – PPO | Admitting: Sports Medicine

## 2023-09-10 VITALS — BP 108/68 | HR 70 | Ht 71.0 in | Wt 166.0 lb

## 2023-09-10 DIAGNOSIS — M25571 Pain in right ankle and joints of right foot: Secondary | ICD-10-CM

## 2023-09-10 NOTE — Patient Instructions (Signed)
Meloxicam day before , day of , and day after race Use medication as needed no more than 1 times a week  As needed follow up

## 2023-10-17 ENCOUNTER — Other Ambulatory Visit (INDEPENDENT_AMBULATORY_CARE_PROVIDER_SITE_OTHER): Payer: BC Managed Care – PPO

## 2023-10-17 DIAGNOSIS — E538 Deficiency of other specified B group vitamins: Secondary | ICD-10-CM

## 2023-10-17 DIAGNOSIS — Z125 Encounter for screening for malignant neoplasm of prostate: Secondary | ICD-10-CM

## 2023-10-17 DIAGNOSIS — R7989 Other specified abnormal findings of blood chemistry: Secondary | ICD-10-CM

## 2023-10-17 DIAGNOSIS — E7800 Pure hypercholesterolemia, unspecified: Secondary | ICD-10-CM

## 2023-10-17 DIAGNOSIS — R739 Hyperglycemia, unspecified: Secondary | ICD-10-CM

## 2023-10-17 LAB — CBC WITH DIFFERENTIAL/PLATELET
Basophils Absolute: 0.1 10*3/uL (ref 0.0–0.1)
Basophils Relative: 1.1 % (ref 0.0–3.0)
Eosinophils Absolute: 0.1 10*3/uL (ref 0.0–0.7)
Eosinophils Relative: 1.1 % (ref 0.0–5.0)
HCT: 49.4 % (ref 39.0–52.0)
Hemoglobin: 16.8 g/dL (ref 13.0–17.0)
Lymphocytes Relative: 35 % (ref 12.0–46.0)
Lymphs Abs: 2 10*3/uL (ref 0.7–4.0)
MCHC: 34 g/dL (ref 30.0–36.0)
MCV: 93 fL (ref 78.0–100.0)
Monocytes Absolute: 0.6 10*3/uL (ref 0.1–1.0)
Monocytes Relative: 9.9 % (ref 3.0–12.0)
Neutro Abs: 3 10*3/uL (ref 1.4–7.7)
Neutrophils Relative %: 52.9 % (ref 43.0–77.0)
Platelets: 185 10*3/uL (ref 150.0–400.0)
RBC: 5.32 Mil/uL (ref 4.22–5.81)
RDW: 12.8 % (ref 11.5–15.5)
WBC: 5.7 10*3/uL (ref 4.0–10.5)

## 2023-10-17 LAB — URINALYSIS, ROUTINE W REFLEX MICROSCOPIC
Leukocytes,Ua: NEGATIVE
Nitrite: NEGATIVE
Specific Gravity, Urine: >=1.03 — AB (ref 1.000–1.030)
Total Protein, Urine: NEGATIVE
Urine Glucose: NEGATIVE
Urobilinogen, UA: 0.2 (ref 0.0–1.0)
pH: 6 (ref 5.0–8.0)

## 2023-10-17 LAB — BASIC METABOLIC PANEL
BUN: 13 mg/dL (ref 6–23)
CO2: 31 meq/L (ref 19–32)
Calcium: 9.6 mg/dL (ref 8.4–10.5)
Chloride: 104 meq/L (ref 96–112)
Creatinine, Ser: 0.91 mg/dL (ref 0.40–1.50)
GFR: 100.13 mL/min (ref >60.00)
Glucose, Bld: 79 mg/dL (ref 70–99)
Potassium: 4 meq/L (ref 3.5–5.1)
Sodium: 141 meq/L (ref 135–145)

## 2023-10-17 LAB — PSA: PSA: 0.76 ng/mL (ref 0.10–4.00)

## 2023-10-17 LAB — HEPATIC FUNCTION PANEL
ALT: 38 U/L (ref 0–53)
AST: 25 U/L (ref 0–37)
Albumin: 4.4 g/dL (ref 3.5–5.2)
Alkaline Phosphatase: 61 U/L (ref 39–117)
Bilirubin, Direct: 0.2 mg/dL (ref 0.0–0.3)
Total Bilirubin: 1.2 mg/dL (ref 0.2–1.2)
Total Protein: 6.7 g/dL (ref 6.0–8.3)

## 2023-10-17 LAB — TSH: TSH: 1.08 u[IU]/mL (ref 0.35–5.50)

## 2023-10-17 LAB — VITAMIN D 25 HYDROXY (VIT D DEFICIENCY, FRACTURES): VITD: 23.6 ng/mL — ABNORMAL LOW (ref 30.00–100.00)

## 2023-10-17 LAB — HEMOGLOBIN A1C: Hgb A1c MFr Bld: 5.4 % (ref 4.6–6.5)

## 2023-10-17 LAB — VITAMIN B12: Vitamin B-12: 198 pg/mL — ABNORMAL LOW (ref 211–911)

## 2023-10-24 ENCOUNTER — Ambulatory Visit (INDEPENDENT_AMBULATORY_CARE_PROVIDER_SITE_OTHER): Payer: BC Managed Care – PPO | Admitting: Internal Medicine

## 2023-10-24 ENCOUNTER — Encounter: Payer: Self-pay | Admitting: Internal Medicine

## 2023-10-24 VITALS — BP 110/64 | HR 60 | Temp 99.0°F | Ht 71.0 in | Wt 164.0 lb

## 2023-10-24 DIAGNOSIS — Z0001 Encounter for general adult medical examination with abnormal findings: Secondary | ICD-10-CM

## 2023-10-24 DIAGNOSIS — E559 Vitamin D deficiency, unspecified: Secondary | ICD-10-CM | POA: Diagnosis not present

## 2023-10-24 DIAGNOSIS — E785 Hyperlipidemia, unspecified: Secondary | ICD-10-CM | POA: Diagnosis not present

## 2023-10-24 DIAGNOSIS — Z125 Encounter for screening for malignant neoplasm of prostate: Secondary | ICD-10-CM

## 2023-10-24 DIAGNOSIS — Z0000 Encounter for general adult medical examination without abnormal findings: Secondary | ICD-10-CM | POA: Diagnosis not present

## 2023-10-24 DIAGNOSIS — R3129 Other microscopic hematuria: Secondary | ICD-10-CM | POA: Diagnosis not present

## 2023-10-24 DIAGNOSIS — E538 Deficiency of other specified B group vitamins: Secondary | ICD-10-CM | POA: Diagnosis not present

## 2023-10-24 DIAGNOSIS — R739 Hyperglycemia, unspecified: Secondary | ICD-10-CM

## 2023-10-24 DIAGNOSIS — R931 Abnormal findings on diagnostic imaging of heart and coronary circulation: Secondary | ICD-10-CM

## 2023-10-24 NOTE — Assessment & Plan Note (Signed)

## 2023-10-24 NOTE — Assessment & Plan Note (Signed)
Lab Results  Component Value Date   LDLCALC 52 04/11/2023   Stable, pt to continue current statin crestor 20 qd

## 2023-10-24 NOTE — Assessment & Plan Note (Signed)
Lab Results  Component Value Date   VITAMINB12 198 (L) 10/17/2023   Low, to start oral replacement - b12 1000 mcg every day, with f/u at 3 mo and next visit

## 2023-10-24 NOTE — Assessment & Plan Note (Signed)
Last vitamin D Lab Results  Component Value Date   VD25OH 23.60 (L) 10/17/2023   Low, to starat oral replacement

## 2023-10-24 NOTE — Patient Instructions (Signed)
Please take all new medication as prescribed - the OTC B12 1000 mcg per day for 3 months  Please go to the LAB at the blood drawing area for the tests to be done - at the [facility] lab for the B12 only in 3 months  Please continue all other medications as before, and refills have been done if requested.  Please have the pharmacy call with any other refills you may need.  Please continue your efforts at being more active, low cholesterol diet, and weight control.  You are otherwise up to date with prevention measures today.  Please keep your appointments with your specialists as you may have planned- urology in jan 2025  Please make an Appointment to return for your 1 year visit, or sooner if needed, with Lab testing by Appointment as well, to be done about 3-5 days before at the FIRST FLOOR Lab (so this is for TWO appointments - please see the scheduling desk as you leave)

## 2023-10-24 NOTE — Assessment & Plan Note (Signed)
Asympt, last seen per urology jan 2024 after CT, has f/u planned jan 2025

## 2023-10-24 NOTE — Progress Notes (Signed)
Patient ID: Marvin [name], male   DOB: [birth year], 47 y.o.   MRN: [id number]         Chief Complaint:: wellness exam and low b12 and vit d, microhematuria, hld        HPI:  Marvin [name] is a 47 y.o. male here for wellness exam; declines covid booster, o/w up to date,  Recently finished [place] marathon last month.                         Also Denies urinary symptoms such as dysuria, frequency, urgency, flank pain, hematuria or n/v, fever, chills.  Pt denies chest pain, increased sob or doe, wheezing, orthopnea, PND, increased LE swelling, palpitations, dizziness or syncope.   Pt denies polydipsia, polyuria, or new focal neuro s/s.    Pt denies fever, wt loss, night sweats, loss of appetite, or other constitutional symptoms    Wt Readings from Last 3 Encounters:  10/24/23 164 lb (74.4 kg)  09/10/23 166 lb (75.3 kg)  08/20/23 164 lb (74.4 kg)   BP Readings from Last 3 Encounters:  10/24/23 110/64  09/10/23 108/68  08/20/23 108/78   Immunization History  Administered Date(s) Administered   Influenza, Mdck, Trivalent,PF 6+ MOS(egg free) 08/19/2023   Influenza-Unspecified 09/02/2015, 08/27/2017, 08/24/2018, 09/04/2020, 09/01/2021, 08/20/2022   PFIZER(Purple Top)SARS-COV-2 Vaccination 03/02/2020, 03/31/2020, 10/16/2020, 08/13/2021, 09/01/2022   Tdap 07/02/2010, 10/17/2020   Health Maintenance Due  Topic Date Due   COVID-19 Vaccine (6 - 2023-24 season) 08/03/2023      Past Medical History:  Diagnosis Date   Chronic kidney disease    Cyst and kidney stone   Other and unspecified hyperlipidemia 01/18/2013   Past Surgical History:  Procedure Laterality Date   APPENDECTOMY  2001    reports that he has never smoked. He has never used smokeless tobacco. He reports current alcohol use. He reports that he does not use drugs. family history includes Diabetes in an other family member; Heart disease in an other family member; Hyperlipidemia in an other family member; Hypertension in an other  family member; Prostate cancer in his father; Sudden death in an other family member. No Known Allergies Current Outpatient Medications on File Prior to Visit  Medication Sig Dispense Refill   aspirin EC 81 MG tablet Take 1 tablet (81 mg total) by mouth daily. Swallow whole. 30 tablet 12   meloxicam (MOBIC) 15 MG tablet Take 1 tablet (15 mg total) by mouth daily. 30 tablet 0   rosuvastatin (CRESTOR) 20 MG tablet Take 1 tablet (20 mg total) by mouth daily. Follow direction given at office visit 90 tablet 3   triamcinolone cream (KENALOG) 0.5 % Apply 1 Application topically 3 (three) times daily. 30 g 1   No current facility-administered medications on file prior to visit.        ROS:  All others reviewed and negative.  Objective        PE:  BP 110/64 (BP Location: Right Arm, Patient Position: Sitting, Cuff Size: Normal)   Pulse 60   Temp 99 F (37.2 C) (Oral)   Ht 5\' 11"  (1.803 m)   Wt 164 lb (74.4 kg)   SpO2 98%   BMI 22.87 kg/m                 Constitutional: Pt appears in NAD               HENT: Head: NCAT.  Right Ear: External ear normal.                 Left Ear: External ear normal.                Eyes: . Pupils are equal, round, and reactive to light. Conjunctivae and EOM are normal               Nose: without d/c or deformity               Neck: Neck supple. Gross normal ROM               Cardiovascular: Normal rate and regular rhythm.                 Pulmonary/Chest: Effort normal and breath sounds without rales or wheezing.                Abd:  Soft, NT, ND, + BS, no organomegaly               Neurological: Pt is alert. At baseline orientation, motor grossly intact               Skin: Skin is warm. No rashes, no other new lesions, LE edema - none               Psychiatric: Pt behavior is normal without agitation   Micro: none  Cardiac tracings I have personally interpreted today:  none  Pertinent Radiological findings (summarize): none   Lab Results   Component Value Date   WBC 5.7 10/17/2023   HGB 16.8 10/17/2023   HCT 49.4 10/17/2023   PLT 185.0 10/17/2023   GLUCOSE 79 10/17/2023   CHOL 126 04/11/2023   TRIG 85.0 04/11/2023   HDL 57.10 04/11/2023   LDLDIRECT 114.1 01/18/2013   LDLCALC 52 04/11/2023   ALT 38 10/17/2023   AST 25 10/17/2023   NA 141 10/17/2023   K 4.0 10/17/2023   CL 104 10/17/2023   CREATININE 0.91 10/17/2023   BUN 13 10/17/2023   CO2 31 10/17/2023   TSH 1.08 10/17/2023   PSA 0.76 10/17/2023   HGBA1C 5.4 10/17/2023   Assessment/Plan:  Marvin [name] is a 47 y.o. White or Caucasian [1] male with  has a past medical history of Chronic kidney disease and Other and unspecified hyperlipidemia (01/18/2013).  Encounter for well adult exam with abnormal findings Age and sex appropriate education and counseling updated with regular exercise and diet Referrals for preventative services - none needed Immunizations addressed - declines covid booster Smoking counseling  - none needed Evidence for depression or other mood disorder - none significant Most recent labs reviewed. I have personally reviewed and have noted: 1) the patient's medical and social history 2) The patient's current medications and supplements 3) The patient's height, weight, and BMI have been recorded in the chart   Vitamin D deficiency Last vitamin D Lab Results  Component Value Date   VD25OH 23.60 (L) 10/17/2023   Low, to starat oral replacement   Microhematuria Asympt, last seen per urology jan 2024 after CT, has f/u planned jan 2025  Hyperlipidemia LDL goal <100 Lab Results  Component Value Date   LDLCALC 52 04/11/2023   Stable, pt to continue current statin crestor 20 qd   Agatston coronary artery calcium score between 100 and 199 Asympt, cont statin with ldl goal < 70  B12 deficiency Lab Results  Component Value Date   VITAMINB12 198 (L) 10/17/2023  Low, to start oral replacement - b12 1000 mcg every day, with f/u  at 3 mo and next visit  Followup: Return in about 1 year (around 10/23/2024).  Oliver Barre, MD 10/24/2023 8:30 PM [facility]

## 2023-10-24 NOTE — Assessment & Plan Note (Signed)
Asympt, cont statin with ldl goal < 70

## 2023-11-19 NOTE — Progress Notes (Unsigned)
[name] [address] [place] [address] Phone: [phone] Subjective:   Marvin [name], am serving as a scribe for Dr. Antoine Primas.  I'm seeing this patient by the request  of:  Corwin Levins, MD  CC: right knee pain   [id number]  Marvin [name] is a 47 y.o. male coming in with complaint of R knee pain. Saw Dr. Jonny Ruiz for R knee pain in 2016. Patient states that he did a marathon in October. After a week rest, he went to run and had pain in anterior aspect of knee from quad tendon to patellar tendon. Notes some tenderness over lateral condyle of femur if he pushes on it.        Past Medical History:  Diagnosis Date   Chronic kidney disease    Cyst and kidney stone   Other and unspecified hyperlipidemia 01/18/2013   Past Surgical History:  Procedure Laterality Date   APPENDECTOMY  2001   Social History   Socioeconomic History   Marital status: Married    Spouse name: Not on file   Number of children: Not on file   Years of education: masters   Highest education level: Not on file  Occupational History   Occupation: Business  Tobacco Use   Smoking status: Never   Smokeless tobacco: Never  Vaping Use   Vaping status: Never Used  Substance and Sexual Activity   Alcohol use: Yes    Comment: occasional social   Drug use: No   Sexual activity: Not on file  Other Topics Concern   Not on file  Social History Narrative   Not on file   Social Drivers of Health   Financial Resource Strain: Not on file  Food Insecurity: Not on file  Transportation Needs: Not on file  Physical Activity: Not on file  Stress: Not on file  Social Connections: Not on file   No Known Allergies Family History  Problem Relation Age of Onset   Prostate cancer Father    Hyperlipidemia Other    Hypertension Other    Heart disease Other    Diabetes Other    Sudden death Other    Colon cancer Neg Hx    Colon polyps Neg Hx    Esophageal  cancer Neg Hx    Stomach cancer Neg Hx    Rectal cancer Neg Hx      Current Outpatient Medications (Cardiovascular):    rosuvastatin (CRESTOR) 20 MG tablet, Take 1 tablet (20 mg total) by mouth daily. Follow direction given at office visit   Current Outpatient Medications (Analgesics):    aspirin EC 81 MG tablet, Take 1 tablet (81 mg total) by mouth daily. Swallow whole.   meloxicam (MOBIC) 15 MG tablet, Take 1 tablet (15 mg total) by mouth daily.   Current Outpatient Medications (Other):    triamcinolone cream (KENALOG) 0.5 %, Apply 1 Application topically 3 (three) times daily.   Vitamin D, Ergocalciferol, (DRISDOL) 1.25 MG (50000 UNIT) CAPS capsule, Take 1 capsule (50,000 Units total) by mouth every 7 (seven) days.   Reviewed prior external information including notes and imaging from  primary care provider As well as notes that were available from care everywhere and other healthcare systems.  Past medical history, social, surgical and family history all reviewed in electronic medical record.  No pertanent information unless stated regarding to the chief complaint.   Review of Systems:  No headache, visual changes, nausea, vomiting, diarrhea, constipation, dizziness, abdominal  pain, skin rash, fevers, chills, night sweats, weight loss, swollen lymph nodes, body aches, joint swelling, chest pain, shortness of breath, mood changes. POSITIVE muscle aches  Objective  Blood pressure 110/72, pulse 72, height 5\' 11"  (1.803 m), weight 169 lb (76.7 kg), SpO2 (!) 8%.   General: No apparent distress alert and oriented x3 mood and affect normal, dressed appropriately.  HEENT: Pupils equal, extraocular movements intact  Respiratory: Patient's speak in full sentences and does not appear short of breath  Cardiovascular: No lower extremity edema, non tender, no erythema  Knee exam shows tenderness to palpation just proximal to the patella tendon on the femur.  No significant bony abnormality  noted.  Good range of motion noted.  No instability of the knee noted.  Limited muscular skeletal ultrasound was performed and interpreted by Antoine Primas, M   Limited ultrasound of patient's knee shows that there is a cortical irregularity noted of the distal femur.  This seems to be cyst if again for a healed stress reaction potentially.  Mild increase in the Doppler flow but no neovascularization in the area.  Does have thickening of the bone still noted to the cortex.  Patellar tendon appears to be unremarkable but does on the tibial tuberosity area has some hypoechoic changes consistent with a resolving bursitis.   Impression and Recommendations:    The above documentation has been reviewed and is accurate and complete Judi Saa, DO

## 2023-11-24 ENCOUNTER — Ambulatory Visit: Payer: BC Managed Care – PPO | Admitting: Family Medicine

## 2023-11-24 ENCOUNTER — Ambulatory Visit (INDEPENDENT_AMBULATORY_CARE_PROVIDER_SITE_OTHER): Payer: BC Managed Care – PPO

## 2023-11-24 ENCOUNTER — Other Ambulatory Visit: Payer: Self-pay

## 2023-11-24 ENCOUNTER — Encounter: Payer: Self-pay | Admitting: Family Medicine

## 2023-11-24 VITALS — BP 110/72 | HR 72 | Ht 71.0 in | Wt 169.0 lb

## 2023-11-24 DIAGNOSIS — M25561 Pain in right knee: Secondary | ICD-10-CM

## 2023-11-24 MED ORDER — VITAMIN D (ERGOCALCIFEROL) 1.25 MG (50000 UNIT) PO CAPS: 50000.0000 [IU] | ORAL_CAPSULE | ORAL | 0 refills | Status: AC

## 2023-11-24 NOTE — Assessment & Plan Note (Addendum)
This seems to be multifactorial.  I do believe the patient may have had a stress reaction of the distal femur.  Likely seems that patient is doing relatively well at the moment with this.  Does have callus formation noted on the ultrasound.  X-rays are pending.  In addition the patient also has a infra patella tendon bursitis.  We discussed with patient about a potential brace, icing regimen, stretches and exercises.  Follow-up again in 6 to 8 weeks.  Worsening pain will need to consider formal physical therapy.  Once weekly vitamin D prescribed.

## 2023-11-24 NOTE — Patient Instructions (Signed)
Xray today Once weekly Vit D for 4 weeks Patellar strap OTC See me again in early February

## 2023-12-03 ENCOUNTER — Other Ambulatory Visit: Payer: Self-pay | Admitting: Internal Medicine

## 2023-12-04 ENCOUNTER — Other Ambulatory Visit: Payer: Self-pay

## 2024-01-01 NOTE — Progress Notes (Deleted)
 Marvin [name] [address] [place] [address] Phone: [phone] Subjective:    I'm seeing this patient by the request  of:  Norleen Lynwood ORN, MD  CC:   [id number]  11/24/2023 This seems to be multifactorial. I do believe the patient may have had a stress reaction of the distal femur. Likely seems that patient is doing relatively well at the moment with this. Does have callus formation noted on the ultrasound. X-rays are pending. In addition the patient also has a infra patella tendon bursitis. We discussed with patient about a potential brace, icing regimen, stretches and exercises. Follow-up again in 6 to 8 weeks. Worsening pain will need to consider formal physical therapy. Once weekly vitamin D  prescribed   Update 01/07/2024 Marvin [name] is a 48 y.o. male coming in with complaint of R knee pain. Patient states   11/24/2023 R knee xray IMPRESSION: Minor peripheral spurring of the lateral patellofemoral compartment.     Past Medical History:  Diagnosis Date   Chronic kidney disease    Cyst and kidney stone   Other and unspecified hyperlipidemia 01/18/2013   Past Surgical History:  Procedure Laterality Date   APPENDECTOMY  2001   Social History   Socioeconomic History   Marital status: Married    Spouse name: Not on file   Number of children: Not on file   Years of education: masters   Highest education level: Not on file  Occupational History   Occupation: Business  Tobacco Use   Smoking status: Never   Smokeless tobacco: Never  Vaping Use   Vaping status: Never Used  Substance and Sexual Activity   Alcohol use: Yes    Comment: occasional social   Drug use: No   Sexual activity: Not on file  Other Topics Concern   Not on file  Social History Narrative   Not on file   Social Drivers of Health   Financial Resource Strain: Not on file  Food Insecurity: Not on file  Transportation Needs: Not on file  Physical Activity:  Not on file  Stress: Not on file  Social Connections: Not on file   No Known Allergies Family History  Problem Relation Age of Onset   Prostate cancer Father    Hyperlipidemia Other    Hypertension Other    Heart disease Other    Diabetes Other    Sudden death Other    Colon cancer Neg Hx    Colon polyps Neg Hx    Esophageal cancer Neg Hx    Stomach cancer Neg Hx    Rectal cancer Neg Hx      Current Outpatient Medications (Cardiovascular):    rosuvastatin  (CRESTOR ) 20 MG tablet, TAKE 1 TABLET BY MOUTH EVERY DAY   Current Outpatient Medications (Analgesics):    aspirin  EC 81 MG tablet, Take 1 tablet (81 mg total) by mouth daily. Swallow whole.   meloxicam  (MOBIC ) 15 MG tablet, Take 1 tablet (15 mg total) by mouth daily.   Current Outpatient Medications (Other):    triamcinolone  cream (KENALOG ) 0.5 %, Apply 1 Application topically 3 (three) times daily.   Vitamin D , Ergocalciferol , (DRISDOL ) 1.25 MG (50000 UNIT) CAPS capsule, Take 1 capsule (50,000 Units total) by mouth every 7 (seven) days.   Reviewed prior external information including notes and imaging from  primary care provider As well as notes that were available from care everywhere and other healthcare systems.  Past medical history, social, surgical and family history all  reviewed in electronic medical record.  No pertanent information unless stated regarding to the chief complaint.   Review of Systems:  No headache, visual changes, nausea, vomiting, diarrhea, constipation, dizziness, abdominal pain, skin rash, fevers, chills, night sweats, weight loss, swollen lymph nodes, body aches, joint swelling, chest pain, shortness of breath, mood changes. POSITIVE muscle aches  Objective  There were no vitals taken for this visit.   General: No apparent distress alert and oriented x3 mood and affect normal, dressed appropriately.  HEENT: Pupils equal, extraocular movements intact  Respiratory: Patient's speak in full  sentences and does not appear short of breath  Cardiovascular: No lower extremity edema, non tender, no erythema      Impression and Recommendations:

## 2024-01-07 ENCOUNTER — Ambulatory Visit: Payer: BC Managed Care – PPO | Admitting: Family Medicine

## 2024-01-20 NOTE — Progress Notes (Unsigned)
[name] [address] [place] [address] Phone: [phone] Subjective:    I'm seeing this patient by the request  of:  Marvin Levins, MD  CC: Right knee pain follow-up  [id number]  11/24/2023 This seems to be multifactorial.  I do believe the patient may have had a stress reaction of the distal femur.  Likely seems that patient is doing relatively well at the moment with this.  Does have callus formation noted on the ultrasound.  X-rays are pending.  In addition the patient also has a infra patella tendon bursitis.  We discussed with patient about a potential brace, icing regimen, stretches and exercises.  Follow-up again in 6 to 8 weeks.  Worsening pain will need to consider formal physical therapy.  Once weekly vitamin D prescribed.     Update 01/23/2024 Marvin [name] is a 48 y.o. male coming in with complaint of R knee pain. Patient states has been wearing patella strap and doing exercises. Seems to be getting a little better. L side pain from falling while running.      Past Medical History:  Diagnosis Date   Chronic kidney disease    Cyst and kidney stone   Other and unspecified hyperlipidemia 01/18/2013   Past Surgical History:  Procedure Laterality Date   APPENDECTOMY  2001   Social History   Socioeconomic History   Marital status: Married    Spouse name: Not on file   Number of children: Not on file   Years of education: masters   Highest education level: Not on file  Occupational History   Occupation: Business  Tobacco Use   Smoking status: Never   Smokeless tobacco: Never  Vaping Use   Vaping status: Never Used  Substance and Sexual Activity   Alcohol use: Yes    Comment: occasional social   Drug use: No   Sexual activity: Not on file  Other Topics Concern   Not on file  Social History Narrative   Not on file   Social Drivers of Health   Financial Resource Strain: Not on file  Food Insecurity: Not on  file  Transportation Needs: Not on file  Physical Activity: Not on file  Stress: Not on file  Social Connections: Not on file   No Known Allergies Family History  Problem Relation Age of Onset   Prostate cancer Father    Hyperlipidemia Other    Hypertension Other    Heart disease Other    Diabetes Other    Sudden death Other    Colon cancer Neg Hx    Colon polyps Neg Hx    Esophageal cancer Neg Hx    Stomach cancer Neg Hx    Rectal cancer Neg Hx      Current Outpatient Medications (Cardiovascular):    rosuvastatin (CRESTOR) 20 MG tablet, TAKE 1 TABLET BY MOUTH EVERY DAY   Current Outpatient Medications (Analgesics):    aspirin EC 81 MG tablet, Take 1 tablet (81 mg total) by mouth daily. Swallow whole.   meloxicam (MOBIC) 15 MG tablet, Take 1 tablet (15 mg total) by mouth daily.   Current Outpatient Medications (Other):    triamcinolone cream (KENALOG) 0.5 %, Apply 1 Application topically 3 (three) times daily.   Vitamin D, Ergocalciferol, (DRISDOL) 1.25 MG (50000 UNIT) CAPS capsule, Take 1 capsule (50,000 Units total) by mouth every 7 (seven) days.   Reviewed prior external information including notes and imaging from  primary care provider As well  as notes that were available from care everywhere and other healthcare systems.  Past medical history, social, surgical and family history all reviewed in electronic medical record.  No pertanent information unless stated regarding to the chief complaint.   Review of Systems:  No headache, visual changes, nausea, vomiting, diarrhea, constipation, dizziness, abdominal pain, skin rash, fevers, chills, night sweats, weight loss, swollen lymph nodes, body aches, joint swelling, chest pain, shortness of breath, mood changes. POSITIVE muscle aches  Objective  Blood pressure 124/80, pulse 73, height 5\' 11"  (1.803 m), weight 161 lb (73 kg), SpO2 97%.   General: No apparent distress alert and oriented x3 mood and affect normal,  dressed appropriately.  HEENT: Pupils equal, extraocular movements intact  Respiratory: Patient's speak in full sentences and does not appear short of breath  Cardiovascular: No lower extremity edema, non tender, no erythema  Right knee exam shows mild tenderness near the tibial tuberosity on the medial aspect of the knee.  No significant swelling noted.  Full strength with extension against resistance.  Patient does have a injury noted to the left flank.  Seems to be healing.  Nontender on exam soft tissue scar tissue formation noted under the skin.   Limited muscular skeletal ultrasound was performed and interpreted by Marvin [name], M  Limited ultrasound of patient's patellar tendon appears to have some increasing in vascular flow.  No retraction noted.   Impression and Recommendations:     The above documentation has been reviewed and is accurate and complete Marvin Saa, DO

## 2024-01-23 ENCOUNTER — Encounter: Payer: Self-pay | Admitting: Family Medicine

## 2024-01-23 ENCOUNTER — Other Ambulatory Visit: Payer: Self-pay

## 2024-01-23 ENCOUNTER — Ambulatory Visit: Payer: BC Managed Care – PPO | Admitting: Family Medicine

## 2024-01-23 VITALS — BP 124/80 | HR 73 | Ht 71.0 in | Wt 161.0 lb

## 2024-01-23 DIAGNOSIS — E538 Deficiency of other specified B group vitamins: Secondary | ICD-10-CM | POA: Diagnosis not present

## 2024-01-23 DIAGNOSIS — M2550 Pain in unspecified joint: Secondary | ICD-10-CM | POA: Diagnosis not present

## 2024-01-23 DIAGNOSIS — M25561 Pain in right knee: Secondary | ICD-10-CM

## 2024-01-23 LAB — CBC WITH DIFFERENTIAL/PLATELET
Basophils Absolute: 0 10*3/uL (ref 0.0–0.1)
Basophils Relative: 0.6 % (ref 0.0–3.0)
Eosinophils Absolute: 0 10*3/uL (ref 0.0–0.7)
Eosinophils Relative: 0.5 % (ref 0.0–5.0)
HCT: 47 % (ref 39.0–52.0)
Hemoglobin: 16.1 g/dL (ref 13.0–17.0)
Lymphocytes Relative: 21.5 % (ref 12.0–46.0)
Lymphs Abs: 1.7 10*3/uL (ref 0.7–4.0)
MCHC: 34.3 g/dL (ref 30.0–36.0)
MCV: 93 fL (ref 78.0–100.0)
Monocytes Absolute: 0.7 10*3/uL (ref 0.1–1.0)
Monocytes Relative: 8.3 % (ref 3.0–12.0)
Neutro Abs: 5.6 10*3/uL (ref 1.4–7.7)
Neutrophils Relative %: 69.1 % (ref 43.0–77.0)
Platelets: 214 10*3/uL (ref 150.0–400.0)
RBC: 5.06 Mil/uL (ref 4.22–5.81)
RDW: 12.8 % (ref 11.5–15.5)
WBC: 8 10*3/uL (ref 4.0–10.5)

## 2024-01-23 LAB — TSH: TSH: 0.99 u[IU]/mL (ref 0.35–5.50)

## 2024-01-23 LAB — COMPREHENSIVE METABOLIC PANEL
ALT: 28 U/L (ref 0–53)
AST: 21 U/L (ref 0–37)
Albumin: 4.3 g/dL (ref 3.5–5.2)
Alkaline Phosphatase: 59 U/L (ref 39–117)
BUN: 14 mg/dL (ref 6–23)
CO2: 29 meq/L (ref 19–32)
Calcium: 9.4 mg/dL (ref 8.4–10.5)
Chloride: 104 meq/L (ref 96–112)
Creatinine, Ser: 0.82 mg/dL (ref 0.40–1.50)
GFR: 104.16 mL/min (ref >60.00)
Glucose, Bld: 81 mg/dL (ref 70–99)
Potassium: 4.2 meq/L (ref 3.5–5.1)
Sodium: 141 meq/L (ref 135–145)
Total Bilirubin: 1 mg/dL (ref 0.2–1.2)
Total Protein: 6.8 g/dL (ref 6.0–8.3)

## 2024-01-23 LAB — VITAMIN D 25 HYDROXY (VIT D DEFICIENCY, FRACTURES): VITD: 30.26 ng/mL (ref 30.00–100.00)

## 2024-01-23 LAB — T4, FREE: Free T4: 0.81 ng/dL (ref 0.60–1.60)

## 2024-01-23 LAB — VITAMIN B12: Vitamin B-12: 607 pg/mL (ref 211–911)

## 2024-01-23 LAB — T3, FREE: T3, Free: 3 pg/mL (ref 2.3–4.2)

## 2024-01-23 MED ORDER — CYANOCOBALAMIN 1000 MCG/ML IJ SOLN
1000.0000 ug | Freq: Once | INTRAMUSCULAR | Status: AC
  Administered 2024-01-23: 1000 ug via INTRAMUSCULAR

## 2024-01-23 NOTE — Assessment & Plan Note (Signed)
Does show improvement noted.  I do think that the bursitis is improving as well.  Do think patient will continue to make some changes we will discuss.  Follow-up with me again in 2 to 3 months otherwise.

## 2024-01-23 NOTE — Patient Instructions (Addendum)
Good to see you. Switch to Dr. Lawerance Bach upstairs let them know I sent you You have a seroma will go away after time [place] for knee E3 Matt Clancy B12 injection today Vit D prescribed  Return in 6 weeks

## 2024-03-04 NOTE — Progress Notes (Unsigned)
 [name] [address] [place] [address] Phone: [phone] Subjective:   INadine Counts, am serving as a scribe for Dr. Antoine Primas.  I'm seeing this patient by the request  of:  Corwin Levins, MD  CC: Left leg pain  [id number]  01/23/2024 Does show improvement noted. I do think that the bursitis is improving as well. Do think patient will continue to make some changes we will discuss. Follow-up with me again in 2 to 3 months otherwise.   Update 03/05/2024 Kayler Rise is a 48 y.o. male coming in with complaint of R knee pain. Patient states knee is doing well. No real pain. Has been wearing strap. Now having some calf pain L side. Felt like he couldn't stretch it. Getting better, but can still feel it when running.      Past Medical History:  Diagnosis Date   Chronic kidney disease    Cyst and kidney stone   Other and unspecified hyperlipidemia 01/18/2013   Past Surgical History:  Procedure Laterality Date   APPENDECTOMY  2001   Social History   Socioeconomic History   Marital status: Married    Spouse name: Not on file   Number of children: Not on file   Years of education: masters   Highest education level: Not on file  Occupational History   Occupation: Business  Tobacco Use   Smoking status: Never   Smokeless tobacco: Never  Vaping Use   Vaping status: Never Used  Substance and Sexual Activity   Alcohol use: Yes    Comment: occasional social   Drug use: No   Sexual activity: Not on file  Other Topics Concern   Not on file  Social History Narrative   Not on file   Social Drivers of Health   Financial Resource Strain: Not on file  Food Insecurity: Not on file  Transportation Needs: Not on file  Physical Activity: Not on file  Stress: Not on file  Social Connections: Not on file   No Known Allergies Family History  Problem Relation Age of Onset   Prostate cancer Father    Hyperlipidemia Other     Hypertension Other    Heart disease Other    Diabetes Other    Sudden death Other    Colon cancer Neg Hx    Colon polyps Neg Hx    Esophageal cancer Neg Hx    Stomach cancer Neg Hx    Rectal cancer Neg Hx      Current Outpatient Medications (Cardiovascular):    rosuvastatin (CRESTOR) 20 MG tablet, TAKE 1 TABLET BY MOUTH EVERY DAY   Current Outpatient Medications (Analgesics):    aspirin EC 81 MG tablet, Take 1 tablet (81 mg total) by mouth daily. Swallow whole.   meloxicam (MOBIC) 15 MG tablet, Take 1 tablet (15 mg total) by mouth daily.   Current Outpatient Medications (Other):    triamcinolone cream (KENALOG) 0.5 %, Apply 1 Application topically 3 (three) times daily.   Vitamin D, Ergocalciferol, (DRISDOL) 1.25 MG (50000 UNIT) CAPS capsule, Take 1 capsule (50,000 Units total) by mouth every 7 (seven) days.   Reviewed prior external information including notes and imaging from  primary care provider As well as notes that were available from care everywhere and other healthcare systems.  Past medical history, social, surgical and family history all reviewed in electronic medical record.  No pertanent information unless stated regarding to the chief complaint.   Review of  Systems:  No headache, visual changes, nausea, vomiting, diarrhea, constipation, dizziness, abdominal pain, skin rash, fevers, chills, night sweats, weight loss, swollen lymph nodes, body aches, joint swelling, chest pain, shortness of breath, mood changes. POSITIVE muscle aches  Objective  Blood pressure 112/74, pulse (!) 59, height 5\' 11"  (1.803 m), weight 164 lb (74.4 kg), SpO2 97%.   General: No apparent distress alert and oriented x3 mood and affect normal, dressed appropriately.  HEENT: Pupils equal, extraocular movements intact  Respiratory: Patient's speak in full sentences and does not appear short of breath  Cardiovascular: No lower extremity edema, non tender, no erythema  Left calf does not have  any specific tenderness noted.  Of the lower extremity there is some varicose veins noted.  Negative straight leg test noted.  Knee seems to be doing relatively well with all range of motion.  No swelling noted.  Limited muscular skeletal ultrasound was performed and interpreted by Antoine Primas, M  Limited ultrasound of the knee does not show any specific findings at the moment.  The popliteal tendon appears to be unremarkable.  Patient's left calf does have multiple varicose veins noted.  Most of them do seem to be compressible.  There is numerous number of them though going through the lateral gastrocnemius head more proximally.    Impression and Recommendations:     The above documentation has been reviewed and is accurate and complete Judi Saa, DO

## 2024-03-05 ENCOUNTER — Ambulatory Visit: Payer: BC Managed Care – PPO | Admitting: Family Medicine

## 2024-03-05 ENCOUNTER — Encounter: Payer: Self-pay | Admitting: Internal Medicine

## 2024-03-05 ENCOUNTER — Encounter: Payer: Self-pay | Admitting: Family Medicine

## 2024-03-05 ENCOUNTER — Other Ambulatory Visit: Payer: Self-pay

## 2024-03-05 VITALS — BP 112/74 | HR 59 | Ht 71.0 in | Wt 164.0 lb

## 2024-03-05 DIAGNOSIS — R739 Hyperglycemia, unspecified: Secondary | ICD-10-CM | POA: Diagnosis not present

## 2024-03-05 DIAGNOSIS — E785 Hyperlipidemia, unspecified: Secondary | ICD-10-CM

## 2024-03-05 DIAGNOSIS — Z125 Encounter for screening for malignant neoplasm of prostate: Secondary | ICD-10-CM | POA: Diagnosis not present

## 2024-03-05 DIAGNOSIS — M79662 Pain in left lower leg: Secondary | ICD-10-CM

## 2024-03-05 DIAGNOSIS — E538 Deficiency of other specified B group vitamins: Secondary | ICD-10-CM

## 2024-03-05 DIAGNOSIS — E559 Vitamin D deficiency, unspecified: Secondary | ICD-10-CM

## 2024-03-05 DIAGNOSIS — S86812A Strain of other muscle(s) and tendon(s) at lower leg level, left leg, initial encounter: Secondary | ICD-10-CM

## 2024-03-05 LAB — CBC WITH DIFFERENTIAL/PLATELET
Basophils Absolute: 0 10*3/uL (ref 0.0–0.1)
Basophils Relative: 0.5 % (ref 0.0–3.0)
Eosinophils Absolute: 0 10*3/uL (ref 0.0–0.7)
Eosinophils Relative: 0.4 % (ref 0.0–5.0)
HCT: 47.8 % (ref 39.0–52.0)
Hemoglobin: 16.6 g/dL (ref 13.0–17.0)
Lymphocytes Relative: 22.6 % (ref 12.0–46.0)
Lymphs Abs: 1.8 10*3/uL (ref 0.7–4.0)
MCHC: 34.6 g/dL (ref 30.0–36.0)
MCV: 92.7 fl (ref 78.0–100.0)
Monocytes Absolute: 0.7 10*3/uL (ref 0.1–1.0)
Monocytes Relative: 8.3 % (ref 3.0–12.0)
Neutro Abs: 5.5 10*3/uL (ref 1.4–7.7)
Neutrophils Relative %: 68.2 % (ref 43.0–77.0)
Platelets: 168 10*3/uL (ref 150.0–400.0)
RBC: 5.16 Mil/uL (ref 4.22–5.81)
RDW: 13.3 % (ref 11.5–15.5)
WBC: 8.1 10*3/uL (ref 4.0–10.5)

## 2024-03-05 LAB — LIPID PANEL
Cholesterol: 141 mg/dL (ref 0–200)
HDL: 58.9 mg/dL (ref >39.00)
LDL Cholesterol: 56 mg/dL (ref 0–99)
NonHDL: 82.21
Total CHOL/HDL Ratio: 2
Triglycerides: 132 mg/dL (ref 0.0–149.0)
VLDL: 26.4 mg/dL (ref 0.0–40.0)

## 2024-03-05 LAB — BASIC METABOLIC PANEL WITH GFR
BUN: 13 mg/dL (ref 6–23)
CO2: 30 meq/L (ref 19–32)
Calcium: 9.8 mg/dL (ref 8.4–10.5)
Chloride: 101 meq/L (ref 96–112)
Creatinine, Ser: 0.89 mg/dL (ref 0.40–1.50)
GFR: 101.54 mL/min (ref >60.00)
Glucose, Bld: 89 mg/dL (ref 70–99)
Potassium: 4.3 meq/L (ref 3.5–5.1)
Sodium: 139 meq/L (ref 135–145)

## 2024-03-05 LAB — URINALYSIS, ROUTINE W REFLEX MICROSCOPIC
Bilirubin Urine: NEGATIVE
Hgb urine dipstick: NEGATIVE
Ketones, ur: NEGATIVE
Leukocytes,Ua: NEGATIVE
Nitrite: NEGATIVE
RBC / HPF: NONE SEEN (ref 0–?)
Specific Gravity, Urine: 1.01 (ref 1.000–1.030)
Total Protein, Urine: NEGATIVE
Urine Glucose: NEGATIVE
Urobilinogen, UA: 0.2 (ref 0.0–1.0)
WBC, UA: NONE SEEN (ref 0–?)
pH: 7 (ref 5.0–8.0)

## 2024-03-05 LAB — HEPATIC FUNCTION PANEL
ALT: 37 U/L (ref 0–53)
AST: 28 U/L (ref 0–37)
Albumin: 4.9 g/dL (ref 3.5–5.2)
Alkaline Phosphatase: 63 U/L (ref 39–117)
Bilirubin, Direct: 0.2 mg/dL (ref 0.0–0.3)
Total Bilirubin: 1.1 mg/dL (ref 0.2–1.2)
Total Protein: 7.1 g/dL (ref 6.0–8.3)

## 2024-03-05 LAB — TSH: TSH: 1.21 u[IU]/mL (ref 0.35–5.50)

## 2024-03-05 LAB — HEMOGLOBIN A1C: Hgb A1c MFr Bld: 5.4 % (ref 4.6–6.5)

## 2024-03-05 LAB — PSA: PSA: 0.68 ng/mL (ref 0.10–4.00)

## 2024-03-05 LAB — VITAMIN B12: Vitamin B-12: 617 pg/mL (ref 211–911)

## 2024-03-05 LAB — VITAMIN D 25 HYDROXY (VIT D DEFICIENCY, FRACTURES): VITD: 26.07 ng/mL — ABNORMAL LOW (ref 30.00–100.00)

## 2024-03-05 NOTE — Patient Instructions (Signed)
 Please wear compression socks with running Stay well-hydrated We will get a D-dimer on the way out to make sure nothing else is going on. Okay with you increasing activity but will do some exercises for afterwards Graston tool could be beneficial. Follow-up with me again in 2 months

## 2024-03-05 NOTE — Assessment & Plan Note (Addendum)
 Likely more of a left calf strain, differential includes a varicose vein, does seem to be somewhat dehydrated as well.  Has had popliteal tendinitisWill continue to monitor overall.  Will get D-dimer with patient's Sister Diane unfortunately after foot surgery from a DVT.  Think it is low likelihood.  Continuing to have pain could consider the possible ABIs and further evaluation by vascular.  Discussed compression while running

## 2024-05-03 NOTE — Progress Notes (Unsigned)
 [facility] [address] [place] [address] Phone: [phone] Subjective:   IBryan Caprio, am serving as a scribe for Dr. Ronnell Coins.  I'm seeing this patient by the request  of:  Roslyn Coombe, MD  CC: Left leg pain, left shoulder pain, right hamstring pain follow-up  [id number]  03/05/2024 Likely more of a left calf strain, differential includes a varicose vein, does seem to be somewhat dehydrated as well.  Has had popliteal tendinitisWill continue to monitor overall.  Will get D-dimer with patient's Sister Diane unfortunately after foot surgery from a DVT.  Think it is low likelihood.  Continuing to have pain could consider the possible ABIs and further evaluation by vascular.  Discussed compression while running      Update 05/05/2024 Kealan Buchan is a 48 y.o. male coming in with complaint of L calf pain. Patient states that's doing well. R side hamstring origin pain, and L shoulder pain. Uncomfortable to sit for a good length of time.      Past Medical History:  Diagnosis Date   Chronic kidney disease    Cyst and kidney stone   Other and unspecified hyperlipidemia 01/18/2013   Past Surgical History:  Procedure Laterality Date   APPENDECTOMY  2001   Social History   Socioeconomic History   Marital status: Married    Spouse name: Not on file   Number of children: Not on file   Years of education: masters   Highest education level: Not on file  Occupational History   Occupation: Business  Tobacco Use   Smoking status: Never   Smokeless tobacco: Never  Vaping Use   Vaping status: Never Used  Substance and Sexual Activity   Alcohol use: Yes    Comment: occasional social   Drug use: No   Sexual activity: Not on file  Other Topics Concern   Not on file  Social History Narrative   Not on file   Social Drivers of Health   Financial Resource Strain: Not on file  Food Insecurity: Not on file  Transportation Needs: Not  on file  Physical Activity: Not on file  Stress: Not on file  Social Connections: Not on file   No Known Allergies Family History  Problem Relation Age of Onset   Prostate cancer Father    Hyperlipidemia Other    Hypertension Other    Heart disease Other    Diabetes Other    Sudden death Other    Colon cancer Neg Hx    Colon polyps Neg Hx    Esophageal cancer Neg Hx    Stomach cancer Neg Hx    Rectal cancer Neg Hx      Current Outpatient Medications (Cardiovascular):    rosuvastatin  (CRESTOR ) 20 MG tablet, TAKE 1 TABLET BY MOUTH EVERY DAY   Current Outpatient Medications (Analgesics):    aspirin  EC 81 MG tablet, Take 1 tablet (81 mg total) by mouth daily. Swallow whole.   meloxicam  (MOBIC ) 15 MG tablet, Take 1 tablet (15 mg total) by mouth daily.   Current Outpatient Medications (Other):    triamcinolone  cream (KENALOG ) 0.5 %, Apply 1 Application topically 3 (three) times daily.   Vitamin D , Ergocalciferol , (DRISDOL ) 1.25 MG (50000 UNIT) CAPS capsule, Take 1 capsule (50,000 Units total) by mouth every 7 (seven) days.   Reviewed prior external information including notes and imaging from  primary care provider As well as notes that were available from care everywhere and other  healthcare systems.  Past medical history, social, surgical and family history all reviewed in electronic medical record.  No pertanent information unless stated regarding to the chief complaint.   Review of Systems:  No headache, visual changes, nausea, vomiting, diarrhea, constipation, dizziness, abdominal pain, skin rash, fevers, chills, night sweats, weight loss, swollen lymph nodes, body aches, joint swelling, chest pain, shortness of breath, mood changes. POSITIVE muscle aches  Objective  Blood pressure 118/62, pulse 69, height 5\' 11"  (1.803 m), weight 164 lb (74.4 kg), SpO2 97%.   General: No apparent distress alert and oriented x3 mood and affect normal, dressed appropriately.  HEENT:  Pupils equal, extraocular movements intact  Respiratory: Patient's speak in full sentences and does not appear short of breath  Cardiovascular: No lower extremity edema, non tender, no erythema  Right hamstring does have some very mild tightness compared to the contralateral side but overall nothing glaring.    Impression and Recommendations:     The above documentation has been reviewed and is accurate and complete Adi Seales M Pershing Skidmore, DO

## 2024-05-05 ENCOUNTER — Other Ambulatory Visit: Payer: Self-pay

## 2024-05-05 ENCOUNTER — Ambulatory Visit (INDEPENDENT_AMBULATORY_CARE_PROVIDER_SITE_OTHER): Admitting: Family Medicine

## 2024-05-05 VITALS — BP 118/62 | HR 69 | Ht 71.0 in | Wt 164.0 lb

## 2024-05-05 DIAGNOSIS — M25561 Pain in right knee: Secondary | ICD-10-CM | POA: Diagnosis not present

## 2024-05-05 DIAGNOSIS — M79604 Pain in right leg: Secondary | ICD-10-CM

## 2024-05-05 NOTE — Patient Instructions (Addendum)
 Thigh compression with running Knee brace on and off alternating Do prescribed exercises at least 3x a week  I'll check doctor situation See you again in 2 months

## 2024-05-05 NOTE — Assessment & Plan Note (Signed)
 More multi functional at the moment.  This seems to have some more of a proximal hamstring that is giving trouble, discussed the possibility of exercises, thigh compression, icing regimen.  I think we can continue to monitor.  I think patient has changed his stride length so we need to monitor as well.  Follow-up with me again in 6 to 8 weeks to further evaluate.

## 2024-05-28 ENCOUNTER — Encounter: Payer: Self-pay | Admitting: Family Medicine

## 2024-06-14 ENCOUNTER — Encounter: Payer: Self-pay | Admitting: Family Medicine

## 2024-06-22 DIAGNOSIS — M778 Other enthesopathies, not elsewhere classified: Secondary | ICD-10-CM | POA: Diagnosis not present

## 2024-06-22 DIAGNOSIS — M25532 Pain in left wrist: Secondary | ICD-10-CM | POA: Diagnosis not present

## 2024-07-08 ENCOUNTER — Ambulatory Visit: Admitting: Family Medicine

## 2024-08-04 NOTE — Progress Notes (Unsigned)
 [name] [address] [place] [address] Phone: [phone] Subjective:   Marvin [name] am a scribe for Dr. Claudene.   I'm seeing this patient by the request  of:  Geofm Glade PARAS, MD  CC: ankle pain f/u  YEP:Dlagzrupcz  05/05/2024  More multi functional at the moment.  This seems to have some more of a proximal hamstring that is giving trouble, discussed the possibility of exercises, thigh compression, icing regimen.  I think we can continue to monitor.  I think patient has changed his stride length so we need to monitor as well.  Follow-up with me again in 6 to 8 weeks to further evaluate.     Update 08/05/2024 Marvin [name] is a 48 y.o. male coming in with complaint of R leg pain. Patient states right leg is more in the hip and glutes. Still having the soreness and tightness. Left leg about 6 weeks ago the achillis tendon was bothering him on a run. Would like the doctor to take a look at it to make sure that everything is ok. Got a big race coming up soon.       Past Medical History:  Diagnosis Date   Chronic kidney disease    Cyst and kidney stone   Other and unspecified hyperlipidemia 01/18/2013   Past Surgical History:  Procedure Laterality Date   APPENDECTOMY  2001   Social History   Socioeconomic History   Marital status: Married    Spouse name: Not on file   Number of children: Not on file   Years of education: masters   Highest education level: Not on file  Occupational History   Occupation: Business  Tobacco Use   Smoking status: Never   Smokeless tobacco: Never  Vaping Use   Vaping status: Never Used  Substance and Sexual Activity   Alcohol use: Yes    Comment: occasional social   Drug use: No   Sexual activity: Not on file  Other Topics Concern   Not on file  Social History Narrative   Not on file   Social Drivers of Health   Financial Resource Strain: Not on file  Food Insecurity: Not on file   Transportation Needs: Not on file  Physical Activity: Not on file  Stress: Not on file  Social Connections: Not on file   No Known Allergies Family History  Problem Relation Age of Onset   Prostate cancer Father    Hyperlipidemia Other    Hypertension Other    Heart disease Other    Diabetes Other    Sudden death Other    Colon cancer Neg Hx    Colon polyps Neg Hx    Esophageal cancer Neg Hx    Stomach cancer Neg Hx    Rectal cancer Neg Hx      Current Outpatient Medications (Cardiovascular):    rosuvastatin  (CRESTOR ) 20 MG tablet, TAKE 1 TABLET BY MOUTH EVERY DAY   Current Outpatient Medications (Analgesics):    aspirin  EC 81 MG tablet, Take 1 tablet (81 mg total) by mouth daily. Swallow whole.   meloxicam  (MOBIC ) 15 MG tablet, Take 1 tablet (15 mg total) by mouth daily.   Current Outpatient Medications (Other):    triamcinolone  cream (KENALOG ) 0.5 %, Apply 1 Application topically 3 (three) times daily.   Vitamin D , Ergocalciferol , (DRISDOL ) 1.25 MG (50000 UNIT) CAPS capsule, Take 1 capsule (50,000 Units total) by mouth every 7 (seven) days.   Reviewed prior external  information including notes and imaging from  primary care provider As well as notes that were available from care everywhere and other healthcare systems.  Past medical history, social, surgical and family history all reviewed in electronic medical record.  No pertanent information unless stated regarding to the chief complaint.   Review of Systems:  No headache, visual changes, nausea, vomiting, diarrhea, constipation, dizziness, abdominal pain, skin rash, fevers, chills, night sweats, weight loss, swollen lymph nodes, body aches, joint swelling, chest pain, shortness of breath, mood changes. POSITIVE muscle aches  Objective  There were no vitals taken for this visit.   General: No apparent distress alert and oriented x3 mood and affect normal, dressed appropriately.  HEENT: Pupils equal, extraocular  movements intact  Respiratory: Patient's speak in full sentences and does not appear short of breath  Cardiovascular: No lower extremity edema, non tender, no erythema  Left ankle exam shows good range of motion noted.  Nontender over the Achilles.  Very mild pain in the retrocalcaneal area.  Neurovascular intact distally.  Limited muscular skeletal ultrasound was performed and interpreted by CLAUDENE HUSSAR, M  Limited ultrasound does show a hypoechoic change that is consistent with a very small retrocalcaneal bursitis.  Achilles tendon has no significant findings at all with no dilatation, no hypoechoic changes and no calcific deposits. Impression: Mild retrocalcaneal bursitis  [address]10; 15 additional minutes spent for Therapeutic exercises as stated in above notes.  This included exercises focusing on stretching, strengthening, with significant focus on eccentric aspects.   Long term goals include an improvement in range of motion, strength, endurance as well as avoiding reinjury. Patient's frequency would include in 1-2 times a day, 3-5 times a week for a duration of 6-12 weeks. Ankle strengthening that included:  Basic range of motion exercises to allow proper full motion at ankle Stretching of the lower leg and hamstrings  Theraband exercises for the lower leg - inversion, eversion, dorsiflexion and plantarflexion each to be completed with a theraband Balance exercises to increase proprioception Weight bearing exercises to increase strength and balance   Proper technique shown and discussed handout in great detail with AT[facility].  All questions were discussed and answered.      Impression and Recommendations:    The above documentation has been reviewed and is accurate and complete Rewa Weissberg M Ubah Radke, DO

## 2024-08-05 ENCOUNTER — Other Ambulatory Visit: Payer: Self-pay

## 2024-08-05 ENCOUNTER — Ambulatory Visit (INDEPENDENT_AMBULATORY_CARE_PROVIDER_SITE_OTHER): Admitting: Family Medicine

## 2024-08-05 ENCOUNTER — Encounter: Payer: Self-pay | Admitting: Family Medicine

## 2024-08-05 VITALS — BP 130/70 | HR 72 | Ht 71.0 in | Wt 159.8 lb

## 2024-08-05 DIAGNOSIS — M25572 Pain in left ankle and joints of left foot: Secondary | ICD-10-CM

## 2024-08-05 DIAGNOSIS — M7752 Other enthesopathy of left foot: Secondary | ICD-10-CM | POA: Diagnosis not present

## 2024-08-05 NOTE — Assessment & Plan Note (Signed)
 More of a retrocalcaneal bursitis.  Seems to be somewhat smaller than what ever caused his discomfort.  Potentially COVID of the burst and gave him the discomfort.  We discussed with patient at this point about potential heel lifts in regular shoes, stretching mechanism, discussed icing regimen and home exercises, increase activity slowly.  Follow-up with me again 6 to 8 weeks otherwise.

## 2024-08-05 NOTE — Patient Instructions (Signed)
 Achilles stretches Heel lift Ice preventatively to back side and foot Keep doing hip abductor exercises See me in 6-8 weeks

## 2024-09-17 ENCOUNTER — Other Ambulatory Visit (HOSPITAL_COMMUNITY): Payer: Self-pay

## 2024-09-17 MED ORDER — COVID-19 MRNA VAC-TRIS(PFIZER) 30 MCG/0.3ML IM SUSY
PREFILLED_SYRINGE | INTRAMUSCULAR | 0 refills | Status: DC
  Filled 2024-09-17: qty 0.3, 1d supply, fill #0

## 2024-09-23 ENCOUNTER — Ambulatory Visit: Admitting: Family Medicine

## 2024-12-07 NOTE — Progress Notes (Unsigned)
 " [name] [address] [place] [address] Phone: [phone] Subjective:   LILLETTE Claretha Schimke am a scribe for Dr. Claudene.   I'm seeing this patient by the request  of:  Geofm Glade PARAS, MD  CC: Hip and hamstring pain  YEP:Dlagzrupcz  Marvin [name] is a 49 y.o. male coming in with complaint of R hip pain. Last seen in September for L ankle pain.  Patient was seen in June where patient was having more hamstring tendinopathy.  Was to do thigh compression and consider knee brace secondary to popliteal tendinitis.  Patient states that the the hip is about the same as it has been. The pain would occur after the races but not during. It is bothering him more now that he has tapered off his running. Ankles are doing fine. Haven't been running much for about 6 weeks now.     Previous x-rays of the right knee that were taken 1 year ago were independently visualized by me showing some mild spurring of the patellofemoral compartment but otherwise fairly unremarkable.  Past Medical History:  Diagnosis Date   Chronic kidney disease    Cyst and kidney stone   Other and unspecified hyperlipidemia 01/18/2013   Past Surgical History:  Procedure Laterality Date   APPENDECTOMY  2001   Social History   Socioeconomic History   Marital status: Married    Spouse name: Not on file   Number of children: Not on file   Years of education: masters   Highest education level: Not on file  Occupational History   Occupation: Business  Tobacco Use   Smoking status: Never   Smokeless tobacco: Never  Vaping Use   Vaping status: Never Used  Substance and Sexual Activity   Alcohol use: Yes    Comment: occasional social   Drug use: No   Sexual activity: Not on file  Other Topics Concern   Not on file  Social History Narrative   Not on file   Social Drivers of Health   Tobacco Use: Low Risk (11/07/2024)   Received from [facility]   Patient History    Smoking  Tobacco Use: Never    Smokeless Tobacco Use: Never    Passive Exposure: Not on file  Financial Resource Strain: Not on file  Food Insecurity: Not on file  Transportation Needs: Not on file  Physical Activity: Not on file  Stress: Not on file  Social Connections: Not on file  Depression (PHQ2-9): Low Risk (10/24/2023)   Depression (PHQ2-9)    PHQ-2 Score: 0  Alcohol Screen: Not on file  Housing: Not on file  Utilities: Not on file  Health Literacy: Not on file   Allergies[1] Family History  Problem Relation Age of Onset   Prostate cancer Father    Hyperlipidemia Other    Hypertension Other    Heart disease Other    Diabetes Other    Sudden death Other    Colon cancer Neg Hx    Colon polyps Neg Hx    Esophageal cancer Neg Hx    Stomach cancer Neg Hx    Rectal cancer Neg Hx     Current Outpatient Medications (Cardiovascular):    rosuvastatin  (CRESTOR ) 20 MG tablet, TAKE 1 TABLET BY MOUTH EVERY DAY  Current Outpatient Medications (Analgesics):    aspirin  EC 81 MG tablet, Take 1 tablet (81 mg total) by mouth daily. Swallow whole.   meloxicam  (MOBIC ) 15 MG tablet, Take 1 tablet (  15 mg total) by mouth daily.  Current Outpatient Medications (Other):    COVID-19 mRNA vaccine, Pfizer, (COMIRNATY ) syringe, Inject into the muscle.   triamcinolone  cream (KENALOG ) 0.5 %, Apply 1 Application topically 3 (three) times daily.   Vitamin D , Ergocalciferol , (DRISDOL ) 1.25 MG (50000 UNIT) CAPS capsule, Take 1 capsule (50,000 Units total) by mouth every 7 (seven) days.   Reviewed prior external information including notes and imaging from  primary care provider As well as notes that were available from care everywhere and other healthcare systems.  Past medical history, social, surgical and family history all reviewed in electronic medical record.  No pertanent information unless stated regarding to the chief complaint.   Review of Systems:  No headache, visual changes, nausea,  vomiting, diarrhea, constipation, dizziness, abdominal pain, skin rash, fevers, chills, night sweats, weight loss, swollen lymph nodes, body aches, joint swelling, chest pain, shortness of breath, mood changes. POSITIVE muscle aches  Objective  Blood pressure 128/72, pulse 61, height 5' 11 (1.803 m), weight 169 lb 6.4 oz (76.8 kg), SpO2 97%.   General: No apparent distress alert and oriented x3 mood and affect normal, dressed appropriately.  HEENT: Pupils equal, extraocular movements intact  Respiratory: Patient's speak in full sentences and does not appear short of breath  Cardiovascular: No lower extremity edema, non tender, no erythema  Right hip exam shows relatively good range of motion, positive straight leg test noted on the right side.  Weakness with dorsiflexion with 4 out of 5 strength compared 5 out of 5 strength on the contralateral side.  Patient does have loss of lordosis of the lumbar spine.  More tenderness to palpation on the right side of the back.    Impression and Recommendations:     The above documentation has been reviewed and is accurate and complete Dijon Kohlman M Carling Liberman, DO       [1] No Known Allergies  "

## 2024-12-08 ENCOUNTER — Ambulatory Visit: Admitting: Family Medicine

## 2024-12-08 ENCOUNTER — Other Ambulatory Visit: Payer: Self-pay

## 2024-12-08 ENCOUNTER — Ambulatory Visit

## 2024-12-08 VITALS — BP 128/72 | HR 61 | Ht 71.0 in | Wt 169.4 lb

## 2024-12-08 DIAGNOSIS — M5416 Radiculopathy, lumbar region: Secondary | ICD-10-CM | POA: Diagnosis not present

## 2024-12-08 DIAGNOSIS — M25551 Pain in right hip: Secondary | ICD-10-CM | POA: Diagnosis not present

## 2024-12-08 NOTE — Assessment & Plan Note (Signed)
 Patient has had this difficulty previously but now having radicular symptoms in more of the L5 correspondence.  I am concerned that there is a potential disc herniation.  Has failed conservative therapy including home exercises, formal physical therapy, prednisone .  Now having weakness with dorsiflexion of the foot.  Will get advanced imaging with an MRI to further evaluate.  Patient has had these symptoms now for 7 months and worsening.  Depending on findings could be a candidate for possible epidural depending on diagnosis

## 2024-12-08 NOTE — Patient Instructions (Addendum)
 Good to see you! Xray today MRI [facility]  When we receive your results we will contact you.

## 2024-12-12 ENCOUNTER — Ambulatory Visit (INDEPENDENT_AMBULATORY_CARE_PROVIDER_SITE_OTHER)

## 2024-12-12 DIAGNOSIS — M25551 Pain in right hip: Secondary | ICD-10-CM

## 2024-12-13 ENCOUNTER — Other Ambulatory Visit: Payer: Self-pay | Admitting: Internal Medicine

## 2024-12-13 ENCOUNTER — Other Ambulatory Visit: Payer: Self-pay

## 2024-12-13 ENCOUNTER — Ambulatory Visit: Payer: Self-pay | Admitting: Family Medicine

## 2024-12-21 ENCOUNTER — Ambulatory Visit: Admitting: Family Medicine

## 2024-12-21 VITALS — BP 116/80 | HR 76 | Ht 71.0 in | Wt 170.0 lb

## 2024-12-21 DIAGNOSIS — M5416 Radiculopathy, lumbar region: Secondary | ICD-10-CM | POA: Diagnosis not present

## 2024-12-21 NOTE — Progress Notes (Signed)
 " [name] [address] [place] [address] Phone: [phone] Subjective:   LILLETTE Marvin [name], am serving as a scribe for Dr. Arthea Claudene.  I'm seeing this patient by the request  of:  Geofm Glade PARAS, MD  CC: low back pain   YEP:Dlagzrupcz  Marvin [name] is a 49 y.o. male coming in with complaint of low back pain. He said that he is about the same as last visit. Has not been running or doing other physical activity for a few months.      MRI of the lumbar spine did show that there is a new broad-based bulge at L5-S1 that does seem to cause nerve impingement of the right S1 nerve root.  Past Medical History:  Diagnosis Date   Chronic kidney disease    Cyst and kidney stone   Other and unspecified hyperlipidemia 01/18/2013   Past Surgical History:  Procedure Laterality Date   APPENDECTOMY  2001   Social History   Socioeconomic History   Marital status: Married    Spouse name: Not on file   Number of children: Not on file   Years of education: masters   Highest education level: Not on file  Occupational History   Occupation: Business  Tobacco Use   Smoking status: Never   Smokeless tobacco: Never  Vaping Use   Vaping status: Never Used  Substance and Sexual Activity   Alcohol use: Yes    Comment: occasional social   Drug use: No   Sexual activity: Not on file  Other Topics Concern   Not on file  Social History Narrative   Not on file   Social Drivers of Health   Tobacco Use: Low Risk (11/07/2024)   Received from [facility]   Patient History    Smoking Tobacco Use: Never    Smokeless Tobacco Use: Never    Passive Exposure: Not on file  Financial Resource Strain: Not on file  Food Insecurity: Not on file  Transportation Needs: Not on file  Physical Activity: Not on file  Stress: Not on file  Social Connections: Not on file  Depression (PHQ2-9): Low Risk (10/24/2023)   Depression (PHQ2-9)    PHQ-2 Score: 0   Alcohol Screen: Not on file  Housing: Not on file  Utilities: Not on file  Health Literacy: Not on file   Allergies[1] Family History  Problem Relation Age of Onset   Prostate cancer Father    Hyperlipidemia Other    Hypertension Other    Heart disease Other    Diabetes Other    Sudden death Other    Colon cancer Neg Hx    Colon polyps Neg Hx    Esophageal cancer Neg Hx    Stomach cancer Neg Hx    Rectal cancer Neg Hx     Current Outpatient Medications (Cardiovascular):    rosuvastatin  (CRESTOR ) 20 MG tablet, TAKE ONE TABLET DAILY  Current Outpatient Medications (Analgesics):    aspirin  EC 81 MG tablet, Take 1 tablet (81 mg total) by mouth daily. Swallow whole.   meloxicam  (MOBIC ) 15 MG tablet, Take 1 tablet (15 mg total) by mouth daily.  Current Outpatient Medications (Other):    COVID-19 mRNA vaccine, Pfizer, (COMIRNATY ) syringe, Inject into the muscle.   triamcinolone  cream (KENALOG ) 0.5 %, Apply 1 Application topically 3 (three) times daily.   Vitamin D , Ergocalciferol , (DRISDOL ) 1.25 MG (50000 UNIT) CAPS capsule, Take 1 capsule (50,000 Units total) by mouth every 7 (seven)  days.   Objective  Blood pressure 116/80, pulse 76, height 5' 11 (1.803 m), weight 170 lb (77.1 kg), SpO2 98%.   General: No apparent distress alert and oriented x3 mood and affect normal, dressed appropriately.  HEENT: Pupils equal, extraocular movements intact  Respiratory: Patient's speak in full sentences and does not appear short of breath  Cardiovascular: No lower extremity edema, non tender, no erythema  Low back exam shows patient does have some loss of lordosis but is sitting relatively comfortably.  Deferred the rest of the exam    Impression and Recommendations:    The above documentation has been reviewed and is accurate and complete Amoria Mclees M Teven Mittman, DO        [1] No Known Allergies  "

## 2024-12-21 NOTE — Patient Instructions (Signed)
 Epidural L5/S1 PT with Elaine See me in 6-8 weeks

## 2024-12-21 NOTE — Assessment & Plan Note (Signed)
 Right lumbar radiculopathy.  Discussed with patient at great length about different treatment options.  Went over patient's MRI and anything that also contributing.  Increase activity slowly.  Will try an epidural and then start formal physical therapy which I think will be beneficial for this individual.  Follow-up with me again in 6 to 8 weeks after the injection.  I personally spent a total of 31  minutes in the care of the patient today including preparing to see the patient, getting/reviewing separately obtained history, counseling and educating, placing orders, documenting clinical information in the EHR, independently interpreting results, and communicating results.

## 2024-12-23 NOTE — Discharge Instructions (Signed)

## 2024-12-24 ENCOUNTER — Ambulatory Visit
Admission: RE | Admit: 2024-12-24 | Discharge: 2024-12-24 | Disposition: A | Source: Ambulatory Visit | Attending: Family Medicine | Admitting: Family Medicine

## 2024-12-24 DIAGNOSIS — M5416 Radiculopathy, lumbar region: Secondary | ICD-10-CM

## 2024-12-24 MED ORDER — IOPAMIDOL (ISOVUE-M 200) INJECTION 41%
1.0000 mL | Freq: Once | INTRAMUSCULAR | Status: AC
  Administered 2024-12-24: 1 mL via EPIDURAL

## 2024-12-24 MED ORDER — METHYLPREDNISOLONE ACETATE 40 MG/ML INJ SUSP (RADIOLOG
80.0000 mg | Freq: Once | INTRAMUSCULAR | Status: AC
  Administered 2024-12-24: 80 mg via EPIDURAL

## 2024-12-27 ENCOUNTER — Encounter: Admitting: Internal Medicine

## 2024-12-28 ENCOUNTER — Encounter: Payer: Self-pay | Admitting: Internal Medicine

## 2024-12-28 NOTE — Patient Instructions (Addendum)
 "     Blood work was ordered.       Medications changes include :   None    A referral was ordered and someone will call you to schedule an appointment.     Return in about 1 year (around 12/29/2025) for Physical Exam.    Health Maintenance, Male Adopting a healthy lifestyle and getting preventive care are important in promoting health and wellness. Ask your health care provider about: The right schedule for you to have regular tests and exams. Things you can do on your own to prevent diseases and keep yourself healthy. What should I know about diet, weight, and exercise? Eat a healthy diet  Eat a diet that includes plenty of vegetables, fruits, low-fat dairy products, and lean protein. Do not eat a lot of foods that are high in solid fats, added sugars, or sodium. Maintain a healthy weight Body mass index (BMI) is a measurement that can be used to identify possible weight problems. It estimates body fat based on height and weight. Your health care provider can help determine your BMI and help you achieve or maintain a healthy weight. Get regular exercise Get regular exercise. This is one of the most important things you can do for your health. Most adults should: Exercise for at least 150 minutes each week. The exercise should increase your heart rate and make you sweat (moderate-intensity exercise). Do strengthening exercises at least twice a week. This is in addition to the moderate-intensity exercise. Spend less time sitting. Even light physical activity can be beneficial. Watch cholesterol and blood lipids Have your blood tested for lipids and cholesterol at 49 years of age, then have this test every 5 years. You may need to have your cholesterol levels checked more often if: Your lipid or cholesterol levels are high. You are older than 49 years of age. You are at high risk for heart disease. What should I know about cancer screening? Many types of cancers can be  detected early and may often be prevented. Depending on your health history and family history, you may need to have cancer screening at various ages. This may include screening for: Colorectal cancer. Prostate cancer. Skin cancer. Lung cancer. What should I know about heart disease, diabetes, and high blood pressure? Blood pressure and heart disease High blood pressure causes heart disease and increases the risk of stroke. This is more likely to develop in people who have high blood pressure readings or are overweight. Talk with your health care provider about your target blood pressure readings. Have your blood pressure checked: Every 3-5 years if you are 30-35 years of age. Every year if you are 70 years old or older. If you are between the ages of 49 and 53 and are a current or former smoker, ask your health care provider if you should have a one-time screening for abdominal aortic aneurysm (AAA). Diabetes Have regular diabetes screenings. This checks your fasting blood sugar level. Have the screening done: Once every three years after age 41 if you are at a normal weight and have a low risk for diabetes. More often and at a younger age if you are overweight or have a high risk for diabetes. What should I know about preventing infection? Hepatitis B If you have a higher risk for hepatitis B, you should be screened for this virus. Talk with your health care provider to find out if you are at risk for hepatitis B infection. Hepatitis C Blood testing is  recommended for: Everyone born from [birth year] through 1965. Anyone with known risk factors for hepatitis C. Sexually transmitted infections (STIs) You should be screened each year for STIs, including gonorrhea and chlamydia, if: You are sexually active and are younger than 49 years of age. You are older than 49 years of age and your health care provider tells you that you are at risk for this type of infection. Your sexual activity has changed  since you were last screened, and you are at increased risk for chlamydia or gonorrhea. Ask your health care provider if you are at risk. Ask your health care provider about whether you are at high risk for HIV. Your health care provider may recommend a prescription medicine to help prevent HIV infection. If you choose to take medicine to prevent HIV, you should first get tested for HIV. You should then be tested every 3 months for as long as you are taking the medicine. Follow these instructions at home: Alcohol use Do not drink alcohol if your health care provider tells you not to drink. If you drink alcohol: Limit how much you have to 0-2 drinks a day. Know how much alcohol is in your drink. In the [place] one drink equals one 12 oz bottle of beer (355 mL), one 5 oz glass of wine (148 mL), or one 1 oz glass of hard liquor (44 mL). Lifestyle Do not use any products that contain nicotine or tobacco. These products include cigarettes, chewing tobacco, and vaping devices, such as e-cigarettes. If you need help quitting, ask your health care provider. Do not use street drugs. Do not share needles. Ask your health care provider for help if you need support or information about quitting drugs. General instructions Schedule regular health, dental, and eye exams. Stay current with your vaccines. Tell your health care provider if: You often feel depressed. You have ever been abused or do not feel safe at home. Summary Adopting a healthy lifestyle and getting preventive care are important in promoting health and wellness. Follow your health care provider's instructions about healthy diet, exercising, and getting tested or screened for diseases. Follow your health care provider's instructions on monitoring your cholesterol and blood pressure. This information is not intended to replace advice given to you by your health care provider. Make sure you discuss any questions you have with your health care  provider. Document Revised: 04/09/2021 Document Reviewed: 04/09/2021 Elsevier Patient Education  2024 [employer]. "

## 2024-12-28 NOTE — Therapy (Signed)
 " OUTPATIENT PHYSICAL THERAPY EVALUATION   Patient Name: Marvin [name] MRN: [id number] DOB:[birth year], 49 y.o., male Today's Date: 12/29/2024   END OF SESSION:  PT End of Session - 12/29/24 1316     Visit Number 1    Number of Visits 9    Date for Recertification  02/23/25    Authorization Type BCBS    PT Start Time 0934    PT Stop Time 1028    PT Time Calculation (min) 54 min    Activity Tolerance Patient tolerated treatment well    Behavior During Therapy [facility] for tasks assessed/performed          Past Medical History:  Diagnosis Date   Chronic kidney disease    Cyst and kidney stone   Other and unspecified hyperlipidemia 01/18/2013   Past Surgical History:  Procedure Laterality Date   APPENDECTOMY  2001   Patient Active Problem List   Diagnosis Date Noted   Right lumbar radiculopathy 12/08/2024   Retrocalcaneal bursitis, left 08/05/2024   Strain of left calf muscle 03/05/2024   Microhematuria 10/24/2023   Episode of dizziness 12/14/2022   CAD (coronary artery disease) 12/10/2022   B12 deficiency 10/22/2022   Vitamin D  deficiency 10/21/2022   Bilateral hearing loss 06/02/2022   Renal stone 10/20/2021   Rash 10/19/2021   Right flank pain 10/30/2020   Gross hematuria 10/30/2020   Urinary frequency 10/30/2020   Neuropathy of left medial plantar nerve 09/16/2018   Nonallopathic lesion of rib cage 08/05/2018   Nonallopathic lesion of sacral region 08/05/2018   Injury of clavicle, right, initial encounter 04/02/2018   Hamstring injury, left, initial encounter 12/22/2017   Popliteus tendinitis of right lower extremity 08/22/2017   Poor posture 08/22/2017   Nonallopathic lesion of cervical region 08/22/2017   Nonallopathic lesion of thoracic region 08/22/2017   Nonallopathic lesion of lumbosacral region 08/22/2017   Hemorrhoids, external 10/06/2015   Elevated liver function tests 02/10/2015   Right knee pain 02/10/2015   Hypercholesterolemia 01/18/2013    Encounter for well adult exam with abnormal findings 07/17/2012    PCP: Geofm Glade PARAS, MD   REFERRING PROVIDER: Claudene Arthea HERO, DO  REFERRING DIAG: Lumbar radiculopathy  Rationale for Evaluation and Treatment: Rehabilitation  THERAPY DIAG:  Pain in right hip  Other low back pain  Muscle weakness (generalized)  ONSET DATE: Chronic   SUBJECTIVE SUBJECTIVE STATEMENT: Patient reports bulging disc in his back that hasn't been causing him a significant pain, but pain is mainly in the right gluteal region. He first noticed it last summer when training for ultra marathon, after he ran his butt would be sore when he got in the car. But after he finish his races and training the pain did not go away. He did have an injection last week. Currently he hasn't been doing any exercise for the past few weeks. He states if he sits for long periods of time or with driving then that bothers him the most.   PERTINENT HISTORY:  See PMH above  PAIN:  Are you having pain? Yes:  NPRS scale: 0/10 currently Pain location: Right gluteal region Pain description: slow intense pain that builds Aggravating factors: Sitting, driving Relieving factors: Self massage  PRECAUTIONS: None  RED FLAGS: None   WEIGHT BEARING RESTRICTIONS: No  FALLS:  Has patient fallen in last 6 months? No  PLOF: Independent  PATIENT GOALS: Get back to running,    OBJECTIVE:  Note: Objective measures were completed at Evaluation unless otherwise noted.  PATIENT SURVEYS:  Not formally assessed at eval  COGNITION: Overall cognitive status: Within functional limits for tasks assessed     SENSATION: WFL  MUSCLE LENGTH: Right hamstring slightly more limited compared to left, reports increased calf pulling with added ankle DF  POSTURE:   Grossly WFL  PALPATION: Tender to palpation right piriformis region  Lumbar CPA and UPA mobility grossly WFL, does report slight increase in concordant pain at lower  lumbar region  LUMBAR ROM:   AROM eval  Flexion WFL*  Extension WFL  Right lateral flexion WFL  Left lateral flexion WFL  Right rotation WFL  Left rotation WFL   (Blank rows = not tested)  *Patient reports onset of right gluteal and upper hamstring pain with lumbar flexion and repeated movements  LOWER EXTREMITY ROM:    Patient demonstrates limitations with bilateral hip IR and end range hip flexion  LOWER EXTREMITY MMT:    MMT Right eval Left eval  Hip flexion 4 4  Hip extension 4 4  Hip abduction 4 4+  Hip adduction    Hip internal rotation    Hip external rotation    Knee flexion 5 5  Knee extension 5 5  Ankle dorsiflexion 5 5  Ankle plantarflexion    Ankle inversion    Ankle eversion     (Blank rows = not tested)  FUNCTIONAL TESTS:  DLLT: 30 deg, no pain reported  Bridge with leg extension: patient reports hamstring cramping bilaterally, no concordant pain reported  GAIT: Assistive device utilized: None Level of assistance: Complete Independence Comments: Grossly WFL   TREATMENT [facility] Adult PT Treatment:                                                DATE: 12/29/2024 Prone press up x 10 Side clamshell with blue 2 x 10 PPT 5 x 5 sec Bridge 3 x 10 sec  Discussed likely etiology of symptoms, expectations with healing, activity modification and progression, return to running  PATIENT EDUCATION:  Education details: Exam findings, POC, HEP Person educated: Patient Education method: Explanation, Demonstration, Tactile cues, Verbal cues, and Handouts Education comprehension: verbalized understanding, returned demonstration, verbal cues required, tactile cues required, and needs further education  HOME EXERCISE PROGRAM: Access Code: [email or web address]    ASSESSMENT: CLINICAL IMPRESSION: Patient is a 49 y.o. male who was seen today for physical therapy evaluation and treatment for chronic right gluteal pain that does seem to be primarily related to lumbar radicular  symptoms. He does exhibit a lumbar extension preference with movement, limitations in bilateral hip mobility, strength/activation deficit of glutes that likely contributed to onset and persistent symptoms to seem to be impacting his functional ability.  OBJECTIVE IMPAIRMENTS: decreased activity tolerance, decreased ROM, decreased strength, impaired flexibility, and pain.   ACTIVITY LIMITATIONS: bending and sitting  PARTICIPATION LIMITATIONS: driving and occupation  PERSONAL FACTORS: Past/current experiences and Time since onset of injury/illness/exacerbation are also affecting patient's functional outcome.   REHAB POTENTIAL: Good  CLINICAL DECISION MAKING: Stable/uncomplicated  EVALUATION COMPLEXITY: Low   GOALS: Goals reviewed with patient? Yes  SHORT TERM GOALS: Target date: 01/26/2025  Patient will be I with initial HEP in order to progress with therapy. Baseline: HEP provided at eval Goal status: INITIAL  2.  Patient will report </= 1/10 right gluteal pain with all sitting/driving activities in order to reduce functional limitations  Baseline: patient report extended periods of sitting or driving can increase pain Goal status: INITIAL  LONG TERM GOALS: Target date: 02/23/2025  Patient will be I with final HEP to maintain progress from PT. Baseline: HEP provided at eval Goal status: INITIAL  2.  Patient will demonstrate lumbar flexion AROM and repeated movements without any increase in pain in order to reduce functional limitations Baseline: patient reports right gluteal pain with lumbar flexion and repeated movements Goal status: INITIAL  3.  Patient will demonstrate gluteal strength >/= 4+/5 MMT in order to improve lumbopelvic control with activity Baseline: see limitations above Goal status: INITIAL  4.  Patient will report ability to return to running without pain or limitation during or post activity in order to reduce functional limitations. Baseline: patient is  currently not running. Goal status: INITIAL   PLAN: PT FREQUENCY: 1-2x/week  PT DURATION: 8 weeks  PLANNED INTERVENTIONS: 97164- PT Re-evaluation, 97750- Physical Performance Testing, 97110-Therapeutic exercises, 97530- Therapeutic activity, [id number]- Neuromuscular re-education, 97535- Self Care, 02859- Manual therapy, 20560 (1-2 muscles), 20561 (3+ muscles)- Dry Needling, Patient/Family education, Joint mobilization, Joint manipulation, Spinal manipulation, Spinal mobilization, Cryotherapy, and Moist heat.  PLAN FOR NEXT SESSION: Review HEP and progress PRN, manual/TPDN for right lumbar/glute/piriformis region, hip mobility and stretching, progress core stabilization and hip strengthening   Elaine Daring, PT, DPT, LAT, ATC 12/29/24  1:35 PM Phone: [phone] Fax: [phone]   "

## 2024-12-28 NOTE — Progress Notes (Unsigned)
 "   Subjective:    Patient ID: Marvin [name], male    DOB: 03-27-1976, 49 y.o.   MRN: [id number]     HPI Marvin [name] is here for a physical exam and his chronic medical problems.  He is here to establish with a new pcp.         Medications and allergies reviewed with patient and updated if appropriate.  Medications Ordered Prior to Encounter[1]  Review of Systems  Constitutional:  Negative for fever.  Eyes:  Negative for visual disturbance.  Respiratory:  Negative for cough, shortness of breath and wheezing.   Cardiovascular:  Negative for chest pain, palpitations and leg swelling.  Gastrointestinal:  Negative for abdominal pain, blood in stool, constipation and diarrhea.       No gerd  Genitourinary:  Negative for difficulty urinating, dysuria and hematuria.  Musculoskeletal:  Negative for arthralgias and back pain.  Skin:  Negative for rash.  Neurological:  Negative for light-headedness and headaches.  Psychiatric/Behavioral:  Negative for dysphoric mood. The patient is not nervous/anxious.        Objective:  There were no vitals filed for this visit. There were no vitals filed for this visit. There is no height or weight on file to calculate BMI.  BP Readings from Last 3 Encounters:  12/24/24 135/83  12/21/24 116/80  12/08/24 128/72    Wt Readings from Last 3 Encounters:  12/21/24 170 lb (77.1 kg)  12/08/24 169 lb 6.4 oz (76.8 kg)  08/05/24 159 lb 12.8 oz (72.5 kg)      Physical Exam Constitutional: He appears well-developed and well-nourished. No distress.  HENT:  Head: Normocephalic and atraumatic.  Right Ear: External ear normal.  Left Ear: External ear normal.  Normal ear canals and TM b/l  Mouth/Throat: Oropharynx is clear and moist. Eyes: Conjunctivae and EOM are normal.  Neck: Neck supple. No tracheal deviation present. No thyromegaly present.  No carotid bruit  Cardiovascular: Normal rate, regular rhythm, normal heart sounds and intact distal  pulses.   No murmur heard.  No lower extremity edema. Pulmonary/Chest: Effort normal and breath sounds normal. No respiratory distress. He has no wheezes. He has no rales.  Abdominal: Soft. He exhibits no distension. There is no tenderness.  Genitourinary: deferred  Lymphadenopathy:   He has no cervical adenopathy.  Skin: Skin is warm and dry. He is not diaphoretic.  Psychiatric: He has a normal mood and affect. His behavior is normal.         Assessment & Plan:   Physical exam: Screening blood work  ordered Exercise    Weight   Substance abuse   none   Reviewed recommended immunizations.   Health Maintenance  Topic Date Due   Hepatitis B Vaccines 19-59 Average Risk (1 of 3 - 19+ 3-dose series) Never done   Colonoscopy  11/29/2028   DTaP/Tdap/Td (3 - Td or Tdap) 10/17/2030   Influenza Vaccine  Completed   HPV VACCINES (No Doses Required) Completed   COVID-19 Vaccine  Completed   Hepatitis C Screening  Completed   HIV Screening  Completed   Pneumococcal Vaccine  Aged Out   Meningococcal B Vaccine  Aged Out     See Problem List for Assessment and Plan of chronic medical problems.      [1]  Current Outpatient Medications on File Prior to Visit  Medication Sig Dispense Refill   aspirin  EC 81 MG tablet Take 1 tablet (81 mg total) by mouth daily. Swallow whole. 30 tablet  12   COVID-19 mRNA vaccine, Pfizer, (COMIRNATY ) syringe Inject into the muscle. 0.3 mL 0   meloxicam  (MOBIC ) 15 MG tablet Take 1 tablet (15 mg total) by mouth daily. 30 tablet 0   rosuvastatin  (CRESTOR ) 20 MG tablet TAKE ONE TABLET DAILY 90 tablet 3   triamcinolone  cream (KENALOG ) 0.5 % Apply 1 Application topically 3 (three) times daily. 30 g 1   Vitamin D , Ergocalciferol , (DRISDOL ) 1.25 MG (50000 UNIT) CAPS capsule Take 1 capsule (50,000 Units total) by mouth every 7 (seven) days. 4 capsule 0   No current facility-administered medications on file prior to visit.   "

## 2024-12-29 ENCOUNTER — Ambulatory Visit: Admitting: Internal Medicine

## 2024-12-29 ENCOUNTER — Other Ambulatory Visit: Payer: Self-pay

## 2024-12-29 ENCOUNTER — Ambulatory Visit: Admitting: Physical Therapy

## 2024-12-29 ENCOUNTER — Encounter: Payer: Self-pay | Admitting: Physical Therapy

## 2024-12-29 DIAGNOSIS — M5459 Other low back pain: Secondary | ICD-10-CM

## 2024-12-29 DIAGNOSIS — M6281 Muscle weakness (generalized): Secondary | ICD-10-CM

## 2024-12-29 DIAGNOSIS — M25551 Pain in right hip: Secondary | ICD-10-CM

## 2024-12-29 NOTE — Patient Instructions (Signed)
 Access Code: [email or web address] URL: [email or web address] Date: 12/29/2024 Prepared by: Elaine Daring  Exercises - Prone Press Up  - 1-3 x daily - 3 sets - 10 reps - Clam with Resistance  - 1 x daily - 3 sets - 12 reps - Bridge  - 1 x daily - 3 sets - 5 reps - 10 seconds hold

## 2025-01-06 ENCOUNTER — Encounter: Payer: Self-pay | Admitting: Physical Therapy

## 2025-01-06 ENCOUNTER — Ambulatory Visit: Admitting: Physical Therapy

## 2025-01-06 ENCOUNTER — Other Ambulatory Visit: Payer: Self-pay

## 2025-01-06 DIAGNOSIS — M5459 Other low back pain: Secondary | ICD-10-CM | POA: Diagnosis not present

## 2025-01-06 DIAGNOSIS — M6281 Muscle weakness (generalized): Secondary | ICD-10-CM | POA: Diagnosis not present

## 2025-01-06 DIAGNOSIS — M25551 Pain in right hip: Secondary | ICD-10-CM | POA: Diagnosis not present

## 2025-01-06 NOTE — Patient Instructions (Signed)
 Access Code: [email or web address] URL: [email or web address] Date: 01/06/2025 Prepared by: Elaine Daring  Exercises - Half Kneeling Hip Flexor Stretch  - 1 x daily - 3 reps - 15-20 seconds hold - Clam with Resistance  - 1 x daily - 3 sets - 12 reps - Bridge with Resistance  - 1 x daily - 3 sets - 5 reps - 10 seconds hold - Primal Push Up  - 1 x daily - 6 reps - 10 seconds hold

## 2025-01-06 NOTE — Therapy (Signed)
 " OUTPATIENT PHYSICAL THERAPY TREATMENT   Patient Name: Marvin [name] MRN: [id number] DOB:20-Nov-1976, 49 y.o., male Today's Date: 01/06/2025   END OF SESSION:  PT End of Session - 01/06/25 0847     Visit Number 2    Number of Visits 9    Date for Recertification  02/23/25    Authorization Type BCBS    PT Start Time 0845    PT Stop Time 0940    PT Time Calculation (min) 55 min    Activity Tolerance Patient tolerated treatment well    Behavior During Therapy [facility] for tasks assessed/performed           Past Medical History:  Diagnosis Date   Chronic kidney disease    Cyst and kidney stone   Other and unspecified hyperlipidemia 01/18/2013   Past Surgical History:  Procedure Laterality Date   APPENDECTOMY  2001   Patient Active Problem List   Diagnosis Date Noted   Right lumbar radiculopathy 12/08/2024   Retrocalcaneal bursitis, left 08/05/2024   Strain of left calf muscle 03/05/2024   Microhematuria 10/24/2023   Episode of dizziness 12/14/2022   CAD (coronary artery disease) 12/10/2022   B12 deficiency 10/22/2022   Vitamin D  deficiency 10/21/2022   Bilateral hearing loss 06/02/2022   Renal stone 10/20/2021   Rash 10/19/2021   Right flank pain 10/30/2020   Gross hematuria 10/30/2020   Urinary frequency 10/30/2020   Neuropathy of left medial plantar nerve 09/16/2018   Nonallopathic lesion of rib cage 08/05/2018   Nonallopathic lesion of sacral region 08/05/2018   Injury of clavicle, right, initial encounter 04/02/2018   Hamstring injury, left, initial encounter 12/22/2017   Popliteus tendinitis of right lower extremity 08/22/2017   Poor posture 08/22/2017   Nonallopathic lesion of cervical region 08/22/2017   Nonallopathic lesion of thoracic region 08/22/2017   Nonallopathic lesion of lumbosacral region 08/22/2017   Hemorrhoids, external 10/06/2015   Elevated liver function tests 02/10/2015   Right knee pain 02/10/2015   Hypercholesterolemia 01/18/2013    Encounter for well adult exam with abnormal findings 07/17/2012    PCP: Geofm Glade PARAS, MD   REFERRING PROVIDER: Claudene Arthea HERO, DO  REFERRING DIAG: Lumbar radiculopathy  Rationale for Evaluation and Treatment: Rehabilitation  THERAPY DIAG:  Pain in right hip  Other low back pain  Muscle weakness (generalized)  ONSET DATE: Chronic   SUBJECTIVE SUBJECTIVE STATEMENT: Patient reports feeling about the same, he has been doing the exercises consistently. He did seem to pull a muscle in the front of the left hip while doing the clamshell exercise. He does report having a little bit of the right gluteal pain last night after sitting a little more.   Eval: Patient reports bulging disc in his back that hasn't been causing him a significant pain, but pain is mainly in the right gluteal region. He first noticed it last summer when training for ultra marathon, after he ran his butt would be sore when he got in the car. But after he finish his races and training the pain did not go away. He did have an injection last week. Currently he hasn't been doing any exercise for the past few weeks. He states if he sits for long periods of time or with driving then that bothers him the most.   PERTINENT HISTORY:  See PMH above  PAIN:  Are you having pain? Yes:  NPRS scale: 0/10 currently Pain location: Right gluteal region Pain description: slow intense pain that builds Aggravating factors:  Sitting, driving Relieving factors: Self massage  PRECAUTIONS: None  PATIENT GOALS: Get back to running   OBJECTIVE:  Note: Objective measures were completed at Evaluation unless otherwise noted. PATIENT SURVEYS:  Not formally assessed at eval  MUSCLE LENGTH: Right hamstring slightly more limited compared to left, reports increased calf pulling with added ankle DF  POSTURE:   Grossly WFL  PALPATION: Tender to palpation right piriformis region  Lumbar CPA and UPA mobility grossly WFL, does  report slight increase in concordant pain at lower lumbar region  LUMBAR ROM:   AROM eval 01/06/2025  Flexion WFL* WFL  Extension [facility] WFL  Right lateral flexion WFL   Left lateral flexion WFL   Right rotation WFL   Left rotation WFL    (Blank rows = not tested)  *Patient reports onset of right gluteal and upper hamstring pain with lumbar flexion and repeated movements  01/07/2024: patient reports no increased right gluteal pain with repeated lumbar flexion  LOWER EXTREMITY ROM:    Patient demonstrates limitations with bilateral hip IR and end range hip flexion  LOWER EXTREMITY MMT:    MMT Right eval Left eval  Hip flexion 4 4  Hip extension 4 4  Hip abduction 4 4+  Hip adduction    Hip internal rotation    Hip external rotation    Knee flexion 5 5  Knee extension 5 5  Ankle dorsiflexion 5 5  Ankle plantarflexion    Ankle inversion    Ankle eversion     (Blank rows = not tested)  FUNCTIONAL TESTS:  DLLT: 30 deg, no pain reported  Bridge with leg extension: patient reports hamstring cramping bilaterally, no concordant pain reported  GAIT: Assistive device utilized: None Level of assistance: Complete Independence Comments: Grossly WFL   TREATMENT [facility] Adult PT Treatment:                                                DATE: 01/06/2025 Recumbent bike L5 x 5 min to improve endurance and workload capacity LAD for right hip and lumbar distraction x 3 bouts  Right hip posterior lateral mobs in supine x 3 bouts L3-5 CPA mobilizations in prone Sidelying lumbar rotational mobilization for right 1/2 kneeling hip flexor stretch 3 x 20 sec on right Bridge with blue at knees 5 x 10 sec Side clamshell with blue 2 x 10 left - trial of various pelvic positions to reduce TFL  Cat cow x 10 Quadruped with knee hover 3 x 10 sec  Spent time discussing left TFL discomfort while performing the side clamshell exercise and various modifications he could try to alleviate the discomfort.  Discussed continued improvement of gluteal engagement and strengthening, and improving hip mobility to allow for better glute activation and reduced stress of lower back.   PATIENT EDUCATION:  Education details: HEP update Person educated: Patient Education method: Explanation, Demonstration, Tactile cues, Verbal cues, and Handouts Education comprehension: verbalized understanding, returned demonstration, verbal cues required, tactile cues required, and needs further education  HOME EXERCISE PROGRAM: Access Code: [email or web address]    ASSESSMENT: CLINICAL IMPRESSION: Patient tolerated therapy well with no adverse effects. Therapy focused on improving hip and lumbar mobility, flexibility, and progressing core and glute strength and control. He tolerated manual for hip and lumbar mobility well and did report no reproduction of right glute pain with hip or lumbar  motion following. Incorporated hip flexor/quad stretch and continued with glute and core strengthening with good tolerance. He does report what seems to be left TFL related discomfort while performing clamshell so worked through modifications with better glute engagement. Updated HEP to progress core and glute strengthening and control and flexibility. Patient would benefit from continued skilled PT to progress mobility and strength in order to reduce pain and maximize functional ability.   Eval: Patient is a 49 y.o. male who was seen today for physical therapy evaluation and treatment for chronic right gluteal pain that does seem to be primarily related to lumbar radicular symptoms. He does exhibit a lumbar extension preference with movement, limitations in bilateral hip mobility, strength/activation deficit of glutes that likely contributed to onset and persistent symptoms to seem to be impacting his functional ability.  OBJECTIVE IMPAIRMENTS: decreased activity tolerance, decreased ROM, decreased strength, impaired flexibility, and pain.    ACTIVITY LIMITATIONS: bending and sitting  PARTICIPATION LIMITATIONS: driving and occupation  PERSONAL FACTORS: Past/current experiences and Time since onset of injury/illness/exacerbation are also affecting patient's functional outcome.    GOALS: Goals reviewed with patient? Yes  SHORT TERM GOALS: Target date: 01/26/2025  Patient will be I with initial HEP in order to progress with therapy. Baseline: HEP provided at eval Goal status: INITIAL  2.  Patient will report </= 1/10 right gluteal pain with all sitting/driving activities in order to reduce functional limitations Baseline: patient report extended periods of sitting or driving can increase pain Goal status: INITIAL  LONG TERM GOALS: Target date: 02/23/2025  Patient will be I with final HEP to maintain progress from PT. Baseline: HEP provided at eval Goal status: INITIAL  2.  Patient will demonstrate lumbar flexion AROM and repeated movements without any increase in pain in order to reduce functional limitations Baseline: patient reports right gluteal pain with lumbar flexion and repeated movements Goal status: INITIAL  3.  Patient will demonstrate gluteal strength >/= 4+/5 MMT in order to improve lumbopelvic control with activity Baseline: see limitations above Goal status: INITIAL  4.  Patient will report ability to return to running without pain or limitation during or post activity in order to reduce functional limitations. Baseline: patient is currently not running. Goal status: INITIAL   PLAN: PT FREQUENCY: 1-2x/week  PT DURATION: 8 weeks  PLANNED INTERVENTIONS: 97164- PT Re-evaluation, 97750- Physical Performance Testing, 97110-Therapeutic exercises, 97530- Therapeutic activity, [id number]- Neuromuscular re-education, 97535- Self Care, 02859- Manual therapy, 20560 (1-2 muscles), 20561 (3+ muscles)- Dry Needling, Patient/Family education, Joint mobilization, Joint manipulation, Spinal manipulation, Spinal  mobilization, Cryotherapy, and Moist heat.  PLAN FOR NEXT SESSION: Review HEP and progress PRN, manual/TPDN for right lumbar/glute/piriformis region, hip mobility and stretching, progress core stabilization and hip strengthening   Elaine Daring, PT, DPT, LAT, ATC 01/06/25  10:51 AM Phone: [phone] Fax: [phone]   "

## 2025-01-12 ENCOUNTER — Encounter: Admitting: Physical Therapy

## 2025-01-19 ENCOUNTER — Encounter: Admitting: Physical Therapy

## 2025-01-26 ENCOUNTER — Encounter: Admitting: Physical Therapy

## 2025-02-03 ENCOUNTER — Ambulatory Visit: Admitting: Family Medicine

## 2025-04-18 ENCOUNTER — Encounter: Admitting: Internal Medicine
